# Patient Record
Sex: Male | Born: 2000 | Hispanic: No | Marital: Single | Smoking: Current some day smoker
Health system: Southern US, Community
[De-identification: ages and names within clinical notes are randomized; demographics above are authoritative.]

## PROBLEM LIST (undated history)

## (undated) DIAGNOSIS — S3991XA Unspecified injury of abdomen, initial encounter: Secondary | ICD-10-CM

## (undated) DIAGNOSIS — S298XXA Other specified injuries of thorax, initial encounter: Secondary | ICD-10-CM

## (undated) DIAGNOSIS — S42201B Unspecified fracture of upper end of right humerus, initial encounter for open fracture: Secondary | ICD-10-CM

## (undated) DIAGNOSIS — S060XAA Concussion with loss of consciousness status unknown, initial encounter: Secondary | ICD-10-CM

## (undated) HISTORY — DX: Concussion with loss of consciousness status unknown, initial encounter: S06.0XAA

## (undated) HISTORY — DX: Other specified injuries of thorax, initial encounter: S29.8XXA

## (undated) HISTORY — DX: Unspecified injury of abdomen, initial encounter: S39.91XA

---
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
## 2018-09-14 ENCOUNTER — Emergency Department (HOSPITAL_COMMUNITY): Payer: Medicaid Other

## 2018-09-14 ENCOUNTER — Other Ambulatory Visit: Payer: Self-pay

## 2018-09-14 ENCOUNTER — Inpatient Hospital Stay (HOSPITAL_COMMUNITY)
Admission: EM | Admit: 2018-09-14 | Discharge: 2018-09-17 | DRG: 958 | Disposition: A | Discharge status: Home / Self Care | Payer: Medicaid Other | Attending: General Surgery | Admitting: General Surgery

## 2018-09-14 ENCOUNTER — Inpatient Hospital Stay (HOSPITAL_COMMUNITY): Payer: Medicaid Other

## 2018-09-14 ENCOUNTER — Encounter (HOSPITAL_COMMUNITY): Payer: Self-pay | Admitting: *Deleted

## 2018-09-14 DIAGNOSIS — W3400XA Accidental discharge from unspecified firearms or gun, initial encounter: Secondary | ICD-10-CM

## 2018-09-14 DIAGNOSIS — D62 Acute posthemorrhagic anemia: Secondary | ICD-10-CM | POA: Diagnosis present

## 2018-09-14 DIAGNOSIS — S143XXA Injury of brachial plexus, initial encounter: Secondary | ICD-10-CM | POA: Diagnosis present

## 2018-09-14 DIAGNOSIS — S2241XA Multiple fractures of ribs, right side, initial encounter for closed fracture: Secondary | ICD-10-CM | POA: Diagnosis present

## 2018-09-14 DIAGNOSIS — S36112A Contusion of liver, initial encounter: Secondary | ICD-10-CM | POA: Diagnosis present

## 2018-09-14 DIAGNOSIS — R402363 Coma scale, best motor response, obeys commands, at hospital admission: Secondary | ICD-10-CM | POA: Diagnosis present

## 2018-09-14 DIAGNOSIS — T148XXA Other injury of unspecified body region, initial encounter: Secondary | ICD-10-CM

## 2018-09-14 DIAGNOSIS — Y92410 Unspecified street and highway as the place of occurrence of the external cause: Secondary | ICD-10-CM | POA: Diagnosis not present

## 2018-09-14 DIAGNOSIS — T1490XA Injury, unspecified, initial encounter: Secondary | ICD-10-CM

## 2018-09-14 DIAGNOSIS — S42201B Unspecified fracture of upper end of right humerus, initial encounter for open fracture: Principal | ICD-10-CM | POA: Diagnosis present

## 2018-09-14 DIAGNOSIS — Y9301 Activity, walking, marching and hiking: Secondary | ICD-10-CM | POA: Diagnosis present

## 2018-09-14 DIAGNOSIS — F17290 Nicotine dependence, other tobacco product, uncomplicated: Secondary | ICD-10-CM | POA: Diagnosis present

## 2018-09-14 DIAGNOSIS — R402253 Coma scale, best verbal response, oriented, at hospital admission: Secondary | ICD-10-CM | POA: Diagnosis present

## 2018-09-14 DIAGNOSIS — M25511 Pain in right shoulder: Secondary | ICD-10-CM | POA: Diagnosis present

## 2018-09-14 DIAGNOSIS — S2239XA Fracture of one rib, unspecified side, initial encounter for closed fracture: Secondary | ICD-10-CM

## 2018-09-14 DIAGNOSIS — R402143 Coma scale, eyes open, spontaneous, at hospital admission: Secondary | ICD-10-CM | POA: Diagnosis present

## 2018-09-14 HISTORY — DX: Accidental discharge from unspecified firearms or gun, initial encounter: W34.00XA

## 2018-09-14 HISTORY — DX: Unspecified fracture of upper end of right humerus, initial encounter for open fracture: S42.201B

## 2018-09-14 LAB — BPAM RBC
Blood Product Expiration Date: 202004092359
Blood Product Expiration Date: 202004122359
ISSUE DATE / TIME: 202003230403
ISSUE DATE / TIME: 202003230403
Unit Type and Rh: 5100
Unit Type and Rh: 5100

## 2018-09-14 LAB — URINALYSIS, ROUTINE W REFLEX MICROSCOPIC
Bilirubin Urine: NEGATIVE
Glucose, UA: NEGATIVE mg/dL
Hgb urine dipstick: NEGATIVE
Ketones, ur: NEGATIVE mg/dL
Leukocytes,Ua: NEGATIVE
NITRITE: NEGATIVE
Protein, ur: NEGATIVE mg/dL
Specific Gravity, Urine: >1.046 — ABNORMAL HIGH (ref 1.005–1.030)
pH: 6 (ref 5.0–8.0)

## 2018-09-14 LAB — COMPREHENSIVE METABOLIC PANEL
ALT: 124 U/L — ABNORMAL HIGH (ref 0–44)
ANION GAP: 14 (ref 5–15)
AST: 129 U/L — ABNORMAL HIGH (ref 15–41)
Albumin: 4.4 g/dL (ref 3.5–5.0)
Alkaline Phosphatase: 45 U/L (ref 38–126)
BUN: 12 mg/dL (ref 6–20)
CO2: 19 mmol/L — ABNORMAL LOW (ref 22–32)
Calcium: 9.3 mg/dL (ref 8.9–10.3)
Chloride: 104 mmol/L (ref 98–111)
Creatinine, Ser: 1.19 mg/dL (ref 0.61–1.24)
GFR calc Af Amer: >60 mL/min (ref >60)
GFR calc non Af Amer: >60 mL/min (ref >60)
Glucose, Bld: 138 mg/dL — ABNORMAL HIGH (ref 70–99)
Potassium: 4.1 mmol/L (ref 3.5–5.1)
Sodium: 137 mmol/L (ref 135–145)
Total Bilirubin: 1 mg/dL (ref 0.3–1.2)
Total Protein: 7.1 g/dL (ref 6.5–8.1)

## 2018-09-14 LAB — ABO/RH: ABO/RH(D): O POS

## 2018-09-14 LAB — BPAM FFP
Blood Product Expiration Date: 202004052359
Blood Product Expiration Date: 202004062359
ISSUE DATE / TIME: 202003230403
ISSUE DATE / TIME: 202003230403
Unit Type and Rh: 6200
Unit Type and Rh: 6200

## 2018-09-14 LAB — CBC
HCT: 35.9 % — ABNORMAL LOW (ref 39.0–52.0)
HCT: 37 % — ABNORMAL LOW (ref 39.0–52.0)
HEMATOCRIT: 39.7 % (ref 39.0–52.0)
Hemoglobin: 11.6 g/dL — ABNORMAL LOW (ref 13.0–17.0)
Hemoglobin: 12.7 g/dL — ABNORMAL LOW (ref 13.0–17.0)
Hemoglobin: 11.9 g/dL — ABNORMAL LOW (ref 13.0–17.0)
MCH: 23.5 pg — ABNORMAL LOW (ref 26.0–34.0)
MCH: 23.3 pg — ABNORMAL LOW (ref 26.0–34.0)
MCH: 23.3 pg — AB (ref 26.0–34.0)
MCHC: 32.3 g/dL (ref 30.0–36.0)
MCHC: 32 g/dL (ref 30.0–36.0)
MCHC: 32.2 g/dL (ref 30.0–36.0)
MCV: 72.7 fL — ABNORMAL LOW (ref 80.0–100.0)
MCV: 72.8 fL — ABNORMAL LOW (ref 80.0–100.0)
MCV: 72.4 fL — ABNORMAL LOW (ref 80.0–100.0)
Platelets: 186 10*3/uL (ref 150–400)
Platelets: 224 10*3/uL (ref 150–400)
Platelets: 218 10*3/uL (ref 150–400)
RBC: 4.94 MIL/uL (ref 4.22–5.81)
RBC: 5.45 MIL/uL (ref 4.22–5.81)
RBC: 5.11 MIL/uL (ref 4.22–5.81)
RDW: 14.2 % (ref 11.5–15.5)
RDW: 14.3 % (ref 11.5–15.5)
RDW: 14.2 % (ref 11.5–15.5)
WBC: 18 10*3/uL — ABNORMAL HIGH (ref 4.0–10.5)
WBC: 16.7 10*3/uL — ABNORMAL HIGH (ref 4.0–10.5)
WBC: 9.9 10*3/uL (ref 4.0–10.5)
nRBC: 0 % (ref 0.0–0.2)
nRBC: 0 % (ref 0.0–0.2)
nRBC: 0 % (ref 0.0–0.2)

## 2018-09-14 LAB — BASIC METABOLIC PANEL
Anion gap: 12 (ref 5–15)
BUN: 11 mg/dL (ref 6–20)
CO2: 20 mmol/L — ABNORMAL LOW (ref 22–32)
CREATININE: 1.04 mg/dL (ref 0.61–1.24)
Calcium: 8.9 mg/dL (ref 8.9–10.3)
Chloride: 104 mmol/L (ref 98–111)
GFR calc Af Amer: >60 mL/min (ref >60)
GFR calc non Af Amer: >60 mL/min (ref >60)
Glucose, Bld: 114 mg/dL — ABNORMAL HIGH (ref 70–99)
Potassium: 3.7 mmol/L (ref 3.5–5.1)
Sodium: 136 mmol/L (ref 135–145)

## 2018-09-14 LAB — TYPE AND SCREEN
ABO/RH(D): O POS
Antibody Screen: NEGATIVE
Unit division: 0
Unit division: 0

## 2018-09-14 LAB — PREPARE FRESH FROZEN PLASMA
Unit division: 0
Unit division: 0

## 2018-09-14 LAB — RAPID URINE DRUG SCREEN, HOSP PERFORMED
Amphetamines: NOT DETECTED
Barbiturates: NOT DETECTED
Benzodiazepines: NOT DETECTED
COCAINE: NOT DETECTED
OPIATES: POSITIVE — AB
Tetrahydrocannabinol: NOT DETECTED

## 2018-09-14 LAB — BLOOD PRODUCT ORDER (VERBAL) VERIFICATION

## 2018-09-14 LAB — ETHANOL

## 2018-09-14 LAB — LACTIC ACID, PLASMA: LACTIC ACID, VENOUS: 2.7 mmol/L — AB (ref 0.5–1.9)

## 2018-09-14 LAB — PROTIME-INR
INR: 1.1 (ref 0.8–1.2)
PROTHROMBIN TIME: 14.3 s (ref 11.4–15.2)

## 2018-09-14 LAB — HIV ANTIBODY (ROUTINE TESTING W REFLEX): HIV Screen 4th Generation wRfx: NONREACTIVE

## 2018-09-14 LAB — CDS SEROLOGY

## 2018-09-14 MED ORDER — MORPHINE SULFATE (PF) 4 MG/ML IV SOLN
4.0000 mg | Freq: Once | INTRAVENOUS | Status: AC
  Administered 2018-09-14: 4 mg via INTRAVENOUS

## 2018-09-14 MED ORDER — ONDANSETRON 4 MG PO TBDP: 4.0000 mg | ORAL_TABLET | Freq: Four times a day (QID) | ORAL | Status: DC | PRN

## 2018-09-14 MED ORDER — IOHEXOL 300 MG/ML  SOLN
100.0000 mL | Freq: Once | INTRAMUSCULAR | Status: AC | PRN
  Administered 2018-09-14: 100 mL via INTRAVENOUS

## 2018-09-14 MED ORDER — CEFAZOLIN SODIUM-DEXTROSE 2-4 GM/100ML-% IV SOLN
2.0000 g | Freq: Three times a day (TID) | INTRAVENOUS | Status: AC
  Administered 2018-09-14 – 2018-09-15 (×3): 2 g via INTRAVENOUS
  Filled 2018-09-14 (×3): qty 100

## 2018-09-14 MED ORDER — SODIUM CHLORIDE 0.9 % IV BOLUS
1000.0000 mL | Freq: Once | INTRAVENOUS | Status: AC
  Administered 2018-09-14: 1000 mL via INTRAVENOUS

## 2018-09-14 MED ORDER — PANTOPRAZOLE SODIUM 40 MG IV SOLR: 40.0000 mg | Freq: Every day | INTRAVENOUS | Status: DC

## 2018-09-14 MED ORDER — ONDANSETRON HCL 4 MG/2ML IJ SOLN
4.0000 mg | Freq: Four times a day (QID) | INTRAMUSCULAR | Status: DC | PRN
  Administered 2018-09-15: 4 mg via INTRAVENOUS
  Filled 2018-09-14: qty 2

## 2018-09-14 MED ORDER — KCL IN DEXTROSE-NACL 20-5-0.45 MEQ/L-%-% IV SOLN
INTRAVENOUS | Status: DC
  Administered 2018-09-14 – 2018-09-15 (×2): via INTRAVENOUS
  Filled 2018-09-14 (×4): qty 1000

## 2018-09-14 MED ORDER — METHOCARBAMOL 750 MG PO TABS
750.0000 mg | ORAL_TABLET | Freq: Three times a day (TID) | ORAL | Status: DC
  Administered 2018-09-14 – 2018-09-17 (×9): 750 mg via ORAL
  Filled 2018-09-14 (×8): qty 1

## 2018-09-14 MED ORDER — PANTOPRAZOLE SODIUM 40 MG PO TBEC
40.0000 mg | DELAYED_RELEASE_TABLET | Freq: Every day | ORAL | Status: DC
  Administered 2018-09-14 – 2018-09-17 (×3): 40 mg via ORAL
  Filled 2018-09-14 (×4): qty 1

## 2018-09-14 MED ORDER — ACETAMINOPHEN 325 MG PO TABS: 650.0000 mg | ORAL_TABLET | ORAL | Status: DC | PRN

## 2018-09-14 MED ORDER — FENTANYL CITRATE (PF) 100 MCG/2ML IJ SOLN
INTRAMUSCULAR | Status: AC
  Filled 2018-09-14: qty 2

## 2018-09-14 MED ORDER — OXYCODONE HCL 5 MG PO TABS
10.0000 mg | ORAL_TABLET | ORAL | Status: DC | PRN
  Administered 2018-09-14 – 2018-09-17 (×9): 10 mg via ORAL
  Filled 2018-09-14 (×9): qty 2

## 2018-09-14 MED ORDER — OXYCODONE HCL 5 MG PO TABS
5.0000 mg | ORAL_TABLET | ORAL | Status: DC | PRN
  Administered 2018-09-15: 5 mg via ORAL
  Filled 2018-09-14: qty 1

## 2018-09-14 MED ORDER — ACETAMINOPHEN 500 MG PO TABS
1000.0000 mg | ORAL_TABLET | Freq: Four times a day (QID) | ORAL | Status: AC
  Administered 2018-09-14 – 2018-09-15 (×5): 1000 mg via ORAL
  Filled 2018-09-14 (×5): qty 2

## 2018-09-14 MED ORDER — HYDROMORPHONE HCL 1 MG/ML IJ SOLN
1.0000 mg | INTRAMUSCULAR | Status: DC | PRN
  Administered 2018-09-14 (×3): 1 mg via INTRAVENOUS
  Filled 2018-09-14 (×3): qty 1

## 2018-09-14 MED ORDER — HYDRALAZINE HCL 20 MG/ML IJ SOLN: 10.0000 mg | INTRAMUSCULAR | Status: DC | PRN

## 2018-09-14 MED ORDER — METHOCARBAMOL 500 MG PO TABS
500.0000 mg | ORAL_TABLET | Freq: Three times a day (TID) | ORAL | Status: DC
  Filled 2018-09-14: qty 1
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
## 2018-09-14 NOTE — ED Notes (Signed)
Fentanyl 100 mg iv for DODQ\[id number]\

## 2018-09-14 NOTE — Plan of Care (Signed)

## 2018-09-14 NOTE — Progress Notes (Signed)
Orthopedic Tech Progress Note Patient Details:  Thomas Anthony 11-07-2000 [id number]  Ortho Devices Type of Ortho Device: Sling immobilizer Ortho Device/Splint Location: URE Ortho Device/Splint Interventions: Adjustment, Application, Ordered   Post Interventions Patient Tolerated: Fair Instructions Provided: Care of device, Adjustment of device   Janit Pagan 09/14/2018, 8:03 AM
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
## 2018-09-14 NOTE — ED Notes (Signed)
Pt talking on the phone to his brother
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
## 2018-09-14 NOTE — ED Notes (Signed)
pts mother is here

## 2018-09-14 NOTE — ED Notes (Signed)
[facility] at  The bedside
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
## 2018-09-14 NOTE — ED Notes (Signed)
bp 122/80 p92

## 2018-09-14 NOTE — ED Notes (Signed)
Morphine 4mg   Claiborne Billings m rn iv

## 2018-09-14 NOTE — ED Notes (Signed)
I heard dr Grandville Silos tell the  edp that he was going to call a ortho doctor but he did not call in my presence and I have no ideal if and who and what time he called if he did
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
## 2018-09-14 NOTE — ED Notes (Signed)
Dr Stark Jock talking to the mother at bedside
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
## 2018-09-15 ENCOUNTER — Inpatient Hospital Stay (HOSPITAL_COMMUNITY): Payer: Medicaid Other

## 2018-09-15 ENCOUNTER — Encounter (HOSPITAL_COMMUNITY): Admission: EM | Disposition: A | Discharge status: Home / Self Care | Payer: Self-pay | Source: Home / Self Care

## 2018-09-15 ENCOUNTER — Inpatient Hospital Stay (HOSPITAL_COMMUNITY): Payer: Medicaid Other | Admitting: Anesthesiology

## 2018-09-15 HISTORY — PX: ORIF HUMERUS FRACTURE: SHX2126

## 2018-09-15 LAB — CBC
HCT: 34.5 % — ABNORMAL LOW (ref 39.0–52.0)
Hemoglobin: 10.9 g/dL — ABNORMAL LOW (ref 13.0–17.0)
MCH: 22.8 pg — ABNORMAL LOW (ref 26.0–34.0)
MCHC: 31.6 g/dL (ref 30.0–36.0)
MCV: 72 fL — AB (ref 80.0–100.0)
NRBC: 0 % (ref 0.0–0.2)
Platelets: 214 10*3/uL (ref 150–400)
RBC: 4.79 MIL/uL (ref 4.22–5.81)
RDW: 13.9 % (ref 11.5–15.5)
WBC: 7 10*3/uL (ref 4.0–10.5)

## 2018-09-15 LAB — MRSA PCR SCREENING: MRSA by PCR: NEGATIVE

## 2018-09-15 SURGERY — OPEN REDUCTION INTERNAL FIXATION (ORIF) PROXIMAL HUMERUS FRACTURE
Anesthesia: Regional | Site: Shoulder | Laterality: Right

## 2018-09-15 MED ORDER — LACTATED RINGERS IV SOLN
INTRAVENOUS | Status: DC | PRN
  Administered 2018-09-15 (×2): via INTRAVENOUS

## 2018-09-15 MED ORDER — CEFAZOLIN SODIUM-DEXTROSE 2-4 GM/100ML-% IV SOLN
2.0000 g | Freq: Four times a day (QID) | INTRAVENOUS | Status: AC
  Administered 2018-09-15 – 2018-09-16 (×3): 2 g via INTRAVENOUS
  Filled 2018-09-15 (×3): qty 100

## 2018-09-15 MED ORDER — MENTHOL 3 MG MT LOZG: 1.0000 | LOZENGE | OROMUCOSAL | Status: DC | PRN

## 2018-09-15 MED ORDER — HYDROMORPHONE HCL 1 MG/ML IJ SOLN
INTRAMUSCULAR | Status: AC
  Filled 2018-09-15: qty 1

## 2018-09-15 MED ORDER — PROPOFOL 10 MG/ML IV BOLUS
INTRAVENOUS | Status: DC | PRN
  Administered 2018-09-15: 200 mg via INTRAVENOUS

## 2018-09-15 MED ORDER — METOCLOPRAMIDE HCL 10 MG PO TABS: 5.0000 mg | ORAL_TABLET | Freq: Three times a day (TID) | ORAL | Status: DC | PRN

## 2018-09-15 MED ORDER — PHENYLEPHRINE 40 MCG/ML (10ML) SYRINGE FOR IV PUSH (FOR BLOOD PRESSURE SUPPORT)
PREFILLED_SYRINGE | INTRAVENOUS | Status: AC
  Filled 2018-09-15: qty 30

## 2018-09-15 MED ORDER — ONDANSETRON HCL 4 MG PO TABS: 4.0000 mg | ORAL_TABLET | Freq: Four times a day (QID) | ORAL | Status: DC | PRN

## 2018-09-15 MED ORDER — BUPIVACAINE LIPOSOME 1.3 % IJ SUSP
INTRAMUSCULAR | Status: DC | PRN
  Administered 2018-09-15: 133 mg via PERINEURAL

## 2018-09-15 MED ORDER — PHENOL 1.4 % MT LIQD: 1.0000 | OROMUCOSAL | Status: DC | PRN

## 2018-09-15 MED ORDER — GABAPENTIN 600 MG PO TABS
300.0000 mg | ORAL_TABLET | Freq: Two times a day (BID) | ORAL | Status: DC
  Administered 2018-09-15 – 2018-09-17 (×5): 300 mg via ORAL
  Filled 2018-09-15 (×5): qty 1

## 2018-09-15 MED ORDER — KETOROLAC TROMETHAMINE 15 MG/ML IJ SOLN
15.0000 mg | Freq: Three times a day (TID) | INTRAMUSCULAR | Status: AC
  Administered 2018-09-15 – 2018-09-17 (×6): 15 mg via INTRAVENOUS
  Filled 2018-09-15 (×6): qty 1

## 2018-09-15 MED ORDER — ONDANSETRON HCL 4 MG/2ML IJ SOLN
INTRAMUSCULAR | Status: AC
  Filled 2018-09-15: qty 2

## 2018-09-15 MED ORDER — FENTANYL CITRATE (PF) 100 MCG/2ML IJ SOLN
INTRAMUSCULAR | Status: DC | PRN
  Administered 2018-09-15 (×2): 50 ug via INTRAVENOUS
  Administered 2018-09-15: 100 ug via INTRAVENOUS

## 2018-09-15 MED ORDER — PROPOFOL 10 MG/ML IV BOLUS
INTRAVENOUS | Status: AC
  Filled 2018-09-15: qty 20

## 2018-09-15 MED ORDER — FENTANYL CITRATE (PF) 250 MCG/5ML IJ SOLN
INTRAMUSCULAR | Status: AC
  Filled 2018-09-15: qty 5

## 2018-09-15 MED ORDER — ROCURONIUM BROMIDE 50 MG/5ML IV SOSY
PREFILLED_SYRINGE | INTRAVENOUS | Status: AC
  Filled 2018-09-15: qty 10

## 2018-09-15 MED ORDER — DEXAMETHASONE SODIUM PHOSPHATE 10 MG/ML IJ SOLN
INTRAMUSCULAR | Status: DC | PRN
  Administered 2018-09-15: 10 mg via INTRAVENOUS

## 2018-09-15 MED ORDER — DEXAMETHASONE SODIUM PHOSPHATE 10 MG/ML IJ SOLN
INTRAMUSCULAR | Status: AC
  Filled 2018-09-15: qty 2

## 2018-09-15 MED ORDER — DOCUSATE SODIUM 100 MG PO CAPS
100.0000 mg | ORAL_CAPSULE | Freq: Two times a day (BID) | ORAL | Status: DC
  Administered 2018-09-15 – 2018-09-17 (×5): 100 mg via ORAL
  Filled 2018-09-15 (×5): qty 1

## 2018-09-15 MED ORDER — SUGAMMADEX SODIUM 200 MG/2ML IV SOLN
INTRAVENOUS | Status: DC | PRN
  Administered 2018-09-15: 200 mg via INTRAVENOUS

## 2018-09-15 MED ORDER — PROMETHAZINE HCL 25 MG/ML IJ SOLN: 6.2500 mg | INTRAMUSCULAR | Status: DC | PRN

## 2018-09-15 MED ORDER — LIDOCAINE 2% (20 MG/ML) 5 ML SYRINGE
INTRAMUSCULAR | Status: AC
  Filled 2018-09-15: qty 5

## 2018-09-15 MED ORDER — ROCURONIUM BROMIDE 50 MG/5ML IV SOSY
PREFILLED_SYRINGE | INTRAVENOUS | Status: DC | PRN
  Administered 2018-09-15: 50 mg via INTRAVENOUS
  Administered 2018-09-15 (×3): 10 mg via INTRAVENOUS

## 2018-09-15 MED ORDER — BUPIVACAINE HCL (PF) 0.5 % IJ SOLN
INTRAMUSCULAR | Status: DC | PRN
  Administered 2018-09-15: 15 mL via PERINEURAL

## 2018-09-15 MED ORDER — ONDANSETRON HCL 4 MG/2ML IJ SOLN
INTRAMUSCULAR | Status: DC | PRN
  Administered 2018-09-15: 4 mg via INTRAVENOUS

## 2018-09-15 MED ORDER — PHENYLEPHRINE HCL 10 MG/ML IJ SOLN
INTRAMUSCULAR | Status: DC | PRN
  Administered 2018-09-15 (×2): 80 ug via INTRAVENOUS

## 2018-09-15 MED ORDER — METOCLOPRAMIDE HCL 5 MG/ML IJ SOLN: 5.0000 mg | Freq: Three times a day (TID) | INTRAMUSCULAR | Status: DC | PRN

## 2018-09-15 MED ORDER — MEPERIDINE HCL 50 MG/ML IJ SOLN: 6.2500 mg | INTRAMUSCULAR | Status: DC | PRN

## 2018-09-15 MED ORDER — ONDANSETRON HCL 4 MG/2ML IJ SOLN: 4.0000 mg | Freq: Four times a day (QID) | INTRAMUSCULAR | Status: DC | PRN

## 2018-09-15 MED ORDER — OXYCODONE HCL 5 MG PO TABS: 5.0000 mg | ORAL_TABLET | Freq: Once | ORAL | Status: DC | PRN

## 2018-09-15 MED ORDER — CEFAZOLIN SODIUM-DEXTROSE 1-4 GM/50ML-% IV SOLN
INTRAVENOUS | Status: DC | PRN
  Administered 2018-09-15: 2 g via INTRAVENOUS

## 2018-09-15 MED ORDER — LIDOCAINE 2% (20 MG/ML) 5 ML SYRINGE
INTRAMUSCULAR | Status: DC | PRN
  Administered 2018-09-15: 100 mg via INTRAVENOUS

## 2018-09-15 MED ORDER — 0.9 % SODIUM CHLORIDE (POUR BTL) OPTIME
TOPICAL | Status: DC | PRN
  Administered 2018-09-15: 1000 mL

## 2018-09-15 MED ORDER — MIDAZOLAM HCL 5 MG/5ML IJ SOLN
INTRAMUSCULAR | Status: DC | PRN
  Administered 2018-09-15: 2 mg via INTRAVENOUS

## 2018-09-15 MED ORDER — MIDAZOLAM HCL 2 MG/2ML IJ SOLN
INTRAMUSCULAR | Status: AC
  Filled 2018-09-15: qty 2

## 2018-09-15 MED ORDER — SODIUM CHLORIDE 0.9 % IV SOLN
INTRAVENOUS | Status: DC | PRN
  Administered 2018-09-15: 50 ug/min via INTRAVENOUS

## 2018-09-15 MED ORDER — OXYCODONE HCL 5 MG/5ML PO SOLN: 5.0000 mg | Freq: Once | ORAL | Status: DC | PRN

## 2018-09-15 MED ORDER — HYDROMORPHONE HCL 1 MG/ML IJ SOLN
0.2500 mg | INTRAMUSCULAR | Status: DC | PRN
  Administered 2018-09-15: 0.5 mg via INTRAVENOUS
  Administered 2018-09-15: 0.25 mg via INTRAVENOUS

## 2018-09-15 SURGICAL SUPPLY — 77 items
BENZOIN TINCTURE PRP APPL 2/3 (GAUZE/BANDAGES/DRESSINGS) ×6 IMPLANT
BIT DRILL 3.2 (BIT) ×2
BIT DRILL 3.2XCALB NS DISP (BIT) ×1 IMPLANT
BIT DRILL CALIBRATED 2.7 (BIT) ×2 IMPLANT
BIT DRILL CALIBRATED 2.7MM (BIT) ×1
BIT DRL 3.2XCALB NS DISP (BIT) ×1
BNDG GAUZE ELAST 4 BULKY (GAUZE/BANDAGES/DRESSINGS) ×6 IMPLANT
BRUSH SCRUB SURG 4.25 DISP (MISCELLANEOUS) ×6 IMPLANT
COVER SURGICAL LIGHT HANDLE (MISCELLANEOUS) ×3 IMPLANT
COVER WAND RF STERILE (DRAPES) ×3 IMPLANT
DRAPE C-ARM 42X72 X-RAY (DRAPES) ×3 IMPLANT
DRAPE C-ARMOR (DRAPES) ×3 IMPLANT
DRAPE IMP U-DRAPE 54X76 (DRAPES) ×3 IMPLANT
DRAPE INCISE IOBAN 66X45 STRL (DRAPES) IMPLANT
DRAPE ORTHO SPLIT 77X108 STRL (DRAPES) ×4
DRAPE SURG ORHT 6 SPLT 77X108 (DRAPES) ×2 IMPLANT
DRAPE U-SHAPE 47X51 STRL (DRAPES) ×3 IMPLANT
DRSG ADAPTIC 3X8 NADH LF (GAUZE/BANDAGES/DRESSINGS) ×3 IMPLANT
DRSG MEPILEX BORDER 4X12 (GAUZE/BANDAGES/DRESSINGS) ×3 IMPLANT
DRSG PAD ABDOMINAL 8X10 ST (GAUZE/BANDAGES/DRESSINGS) ×6 IMPLANT
ELECT REM PT RETURN 9FT ADLT (ELECTROSURGICAL) ×3
ELECTRODE REM PT RTRN 9FT ADLT (ELECTROSURGICAL) ×1 IMPLANT
EVACUATOR 1/8 PVC DRAIN (DRAIN) IMPLANT
GAUZE SPONGE 4X4 12PLY STRL (GAUZE/BANDAGES/DRESSINGS) ×6 IMPLANT
GLOVE BIO SURGEON STRL SZ7.5 (GLOVE) ×3 IMPLANT
GLOVE BIO SURGEON STRL SZ8 (GLOVE) ×3 IMPLANT
GLOVE BIOGEL PI IND STRL 7.5 (GLOVE) ×1 IMPLANT
GLOVE BIOGEL PI IND STRL 8 (GLOVE) ×1 IMPLANT
GLOVE BIOGEL PI INDICATOR 7.5 (GLOVE) ×2
GLOVE BIOGEL PI INDICATOR 8 (GLOVE) ×2
GOWN STRL REUS W/ TWL LRG LVL3 (GOWN DISPOSABLE) ×2 IMPLANT
GOWN STRL REUS W/ TWL XL LVL3 (GOWN DISPOSABLE) ×1 IMPLANT
GOWN STRL REUS W/TWL LRG LVL3 (GOWN DISPOSABLE) ×4
GOWN STRL REUS W/TWL XL LVL3 (GOWN DISPOSABLE) ×2
K-WIRE 2X5 SS THRDED S3 (WIRE) ×15
KIT BASIN OR (CUSTOM PROCEDURE TRAY) ×3 IMPLANT
KIT TURNOVER KIT B (KITS) ×3 IMPLANT
KWIRE 2X5 SS THRDED S3 (WIRE) ×5 IMPLANT
LOOP VESSEL MAXI BLUE (MISCELLANEOUS) IMPLANT
MANIFOLD NEPTUNE II (INSTRUMENTS) ×3 IMPLANT
NS IRRIG 1000ML POUR BTL (IV SOLUTION) ×3 IMPLANT
PACK TOTAL JOINT (CUSTOM PROCEDURE TRAY) ×3 IMPLANT
PACK UNIVERSAL I (CUSTOM PROCEDURE TRAY) ×3 IMPLANT
PAD ARMBOARD 7.5X6 YLW CONV (MISCELLANEOUS) ×3 IMPLANT
PEG LOCKING 3.2MMX44 (Peg) ×6 IMPLANT
PEG LOCKING 3.2MMX46 (Peg) ×3 IMPLANT
PEG LOCKING 3.2X36 (Screw) ×3 IMPLANT
PLATE PHP 11 H LOW RT (Plate) ×3 IMPLANT
SCREW LOW PROF TIS 3.5X28MM (Screw) ×3 IMPLANT
SCREW LOW PROFILE 3.5X30MM TIS (Screw) ×6 IMPLANT
SCREW LP NL T15 3.5X24 (Screw) ×3 IMPLANT
SCREW LP NL T15 3.5X26 (Screw) ×6 IMPLANT
SCREW T15 LP CORT 3.5X50MM NS (Screw) ×3 IMPLANT
SCREW T15 LP CORT 3.5X60MM NS (Screw) ×3 IMPLANT
SCREW T15 MD 3.5X48MM NS (Screw) ×3 IMPLANT
SPONGE LAP 18X18 RF (DISPOSABLE) ×3 IMPLANT
STAPLER VISISTAT 35W (STAPLE) ×3 IMPLANT
STOCKINETTE IMPERVIOUS LG (DRAPES) ×3 IMPLANT
SUCTION FRAZIER HANDLE 10FR (MISCELLANEOUS) ×2
SUCTION TUBE FRAZIER 10FR DISP (MISCELLANEOUS) ×1 IMPLANT
SUT ETHIBOND 5 LR DA (SUTURE) ×3 IMPLANT
SUT ETHILON 2 0 FS 18 (SUTURE) ×12 IMPLANT
SUT FIBERWIRE #2 38 T-5 BLUE (SUTURE) ×3
SUT VIC AB 0 CT1 27 (SUTURE) ×2
SUT VIC AB 0 CT1 27XBRD ANBCTR (SUTURE) ×1 IMPLANT
SUT VIC AB 1 CT1 27 (SUTURE) ×4
SUT VIC AB 1 CT1 27XBRD ANBCTR (SUTURE) ×2 IMPLANT
SUT VIC AB 2-0 CT1 27 (SUTURE) ×4
SUT VIC AB 2-0 CT1 TAPERPNT 27 (SUTURE) ×2 IMPLANT
SUTURE FIBERWR #2 38 T-5 BLUE (SUTURE) ×1 IMPLANT
SYR 5ML LL (SYRINGE) IMPLANT
TAPE CLOTH SURG 4X10 WHT LF (GAUZE/BANDAGES/DRESSINGS) ×3 IMPLANT
TAPE CLOTH SURG 6X10 WHT LF (GAUZE/BANDAGES/DRESSINGS) ×3 IMPLANT
TOWEL OR 17X24 6PK STRL BLUE (TOWEL DISPOSABLE) ×3 IMPLANT
TOWEL OR 17X26 10 PK STRL BLUE (TOWEL DISPOSABLE) ×6 IMPLANT
WATER STERILE IRR 1000ML POUR (IV SOLUTION) ×3 IMPLANT
YANKAUER SUCT BULB TIP NO VENT (SUCTIONS) IMPLANT

## 2018-09-15 NOTE — Transfer of Care (Signed)
Immediate Anesthesia Transfer of Care Note  Patient: Thomas Anthony  Procedure(s) Performed: OPEN REDUCTION INTERNAL FIXATION (ORIF) PROXIMAL HUMERUS FRACTURE (Right Shoulder)  Patient Location: PACU  Anesthesia Type:General  Level of Consciousness: awake, alert  and oriented  Airway & Oxygen Therapy: Patient Spontanous Breathing and Patient connected to face mask oxygen  Post-op Assessment: Report given to RN, Post -op Vital signs reviewed and stable and Patient moving all extremities X 4  Post vital signs: Reviewed and stable  Last Vitals:  Vitals Value Taken Time  BP 143/87 09/15/2018 12:04 PM  Temp    Pulse 87 09/15/2018 12:04 PM  Resp 20 09/15/2018 12:04 PM  SpO2 100 % 09/15/2018 12:04 PM  Vitals shown include unvalidated device data.  Last Pain:  Vitals:   09/15/18 0400  TempSrc: Oral  PainSc:          Complications: No apparent anesthesia complications

## 2018-09-15 NOTE — Plan of Care (Signed)

## 2018-09-15 NOTE — Plan of Care (Signed)
  Problem: Education: Goal: Knowledge of General Education information will improve Description Including pain rating scale, medication(s)/side effects and non-pharmacologic comfort measures 09/15/2018 1537 by Lawanda Cousins, RN Outcome: Progressing 09/15/2018 1527 by Lawanda Cousins, RN Outcome: Progressing   Problem: Health Behavior/Discharge Planning: Goal: Ability to manage health-related needs will improve 09/15/2018 1537 by Lawanda Cousins, RN Outcome: Progressing 09/15/2018 1527 by Lawanda Cousins, RN Outcome: Progressing   Problem: Clinical Measurements: Goal: Ability to maintain clinical measurements within normal limits will improve 09/15/2018 1537 by Lawanda Cousins, RN Outcome: Progressing 09/15/2018 1527 by Lawanda Cousins, RN Outcome: Progressing Goal: Will remain free from infection 09/15/2018 1537 by Lawanda Cousins, RN Outcome: Progressing 09/15/2018 1527 by Lawanda Cousins, RN Outcome: Progressing Goal: Diagnostic test results will improve 09/15/2018 1537 by Lawanda Cousins, RN Outcome: Progressing 09/15/2018 1527 by Lawanda Cousins, RN Outcome: Progressing Goal: Respiratory complications will improve 09/15/2018 1537 by Lawanda Cousins, RN Outcome: Progressing 09/15/2018 1527 by Lawanda Cousins, RN Outcome: Progressing Goal: Cardiovascular complication will be avoided 09/15/2018 1537 by Lawanda Cousins, RN Outcome: Progressing 09/15/2018 1527 by Lawanda Cousins, RN Outcome: Progressing   Problem: Activity: Goal: Risk for activity intolerance will decrease 09/15/2018 1537 by Lawanda Cousins, RN Outcome: Progressing 09/15/2018 1527 by Lawanda Cousins, RN Outcome: Progressing   Problem: Nutrition: Goal: Adequate nutrition will be maintained 09/15/2018 1537 by Lawanda Cousins, RN Outcome: Progressing 09/15/2018 1527 by Lawanda Cousins, RN Outcome: Progressing   Problem: Coping: Goal: Level of anxiety will decrease 09/15/2018 1537 by Lawanda Cousins, RN Outcome:  Progressing 09/15/2018 1527 by Lawanda Cousins, RN Outcome: Progressing   Problem: Elimination: Goal: Will not experience complications related to bowel motility 09/15/2018 1537 by Lawanda Cousins, RN Outcome: Progressing 09/15/2018 1527 by Lawanda Cousins, RN Outcome: Progressing Goal: Will not experience complications related to urinary retention 09/15/2018 1537 by Lawanda Cousins, RN Outcome: Progressing 09/15/2018 1527 by Lawanda Cousins, RN Outcome: Progressing   Problem: Pain Managment: Goal: General experience of comfort will improve 09/15/2018 1537 by Lawanda Cousins, RN Outcome: Progressing 09/15/2018 1527 by Lawanda Cousins, RN Outcome: Progressing   Problem: Safety: Goal: Ability to remain free from injury will improve 09/15/2018 1537 by Lawanda Cousins, RN Outcome: Progressing 09/15/2018 1527 by Lawanda Cousins, RN Outcome: Progressing   Problem: Skin Integrity: Goal: Risk for impaired skin integrity will decrease 09/15/2018 1537 by Lawanda Cousins, RN Outcome: Progressing 09/15/2018 1527 by Lawanda Cousins, RN Outcome: Progressing
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
## 2018-09-15 NOTE — Anesthesia Postprocedure Evaluation (Signed)
Anesthesia Post Note  Patient: Thomas Anthony  Procedure(s) Performed: OPEN REDUCTION INTERNAL FIXATION (ORIF) PROXIMAL HUMERUS FRACTURE (Right Shoulder)     Patient location during evaluation: PACU Anesthesia Type: Regional and General Level of consciousness: awake and alert Pain management: pain level controlled Vital Signs Assessment: post-procedure vital signs reviewed and stable Respiratory status: spontaneous breathing, nonlabored ventilation, respiratory function stable and patient connected to nasal cannula oxygen Cardiovascular status: blood pressure returned to baseline and stable Postop Assessment: no apparent nausea or vomiting Anesthetic complications: no    Last Vitals:  Vitals:   09/15/18 1315 09/15/18 1330  BP: 134/76 (!) 120/54  Pulse: 67 67  Resp: 18 14  Temp:    SpO2: 100% 100%    Last Pain:  Vitals:   09/15/18 1315  TempSrc:   PainSc: 6                  Brianni Manthe

## 2018-09-15 NOTE — Anesthesia Procedure Notes (Signed)
Procedure Name: Intubation Date/Time: 09/15/2018 8:17 AM Performed by: Neldon Newport, CRNA Pre-anesthesia Checklist: Timeout performed, Patient being monitored, Suction available, Emergency Drugs available and Patient identified Patient Re-evaluated:Patient Re-evaluated prior to induction Oxygen Delivery Method: Circle system utilized Preoxygenation: Pre-oxygenation with 100% oxygen Induction Type: IV induction Ventilation: Mask ventilation without difficulty Laryngoscope Size: Mac and 4 Grade View: Grade I Tube type: Oral Tube size: 7.5 mm Number of attempts: 1 Placement Confirmation: breath sounds checked- equal and bilateral,  positive ETCO2 and ETT inserted through vocal cords under direct vision Secured at: 24 cm Tube secured with: Tape Dental Injury: Teeth and Oropharynx as per pre-operative assessment
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
## 2018-09-15 NOTE — Anesthesia Preprocedure Evaluation (Addendum)
Anesthesia Evaluation  Patient identified by MRN, date of birth, ID band Patient awake    Reviewed: Allergy & Precautions, NPO status , Patient's Chart, lab work & pertinent test results  Airway Mallampati: II  TM Distance: >3 FB Neck ROM: Full    Dental  (+) Dental Advisory Given, Teeth Intact   Pulmonary neg pulmonary ROS,    Pulmonary exam normal breath sounds clear to auscultation       Cardiovascular negative cardio ROS Normal cardiovascular exam Rhythm:Regular Rate:Normal     Neuro/Psych negative neurological ROS  negative psych ROS   GI/Hepatic negative GI ROS, Neg liver ROS,   Endo/Other  negative endocrine ROS  Renal/GU negative Renal ROS     Musculoskeletal negative musculoskeletal ROS (+)   Abdominal   Peds  Hematology negative hematology ROS (+)   Anesthesia Other Findings   Reproductive/Obstetrics                            Anesthesia Physical Anesthesia Plan  ASA: I  Anesthesia Plan: General   Post-op Pain Management: GA combined w/ Regional for post-op pain   Induction: Intravenous  PONV Risk Score and Plan: 3 and Ondansetron, Dexamethasone, Midazolam and Treatment may vary due to age or medical condition  Airway Management Planned: Oral ETT  Additional Equipment: None  Intra-op Plan:   Post-operative Plan: Extubation in OR  Informed Consent: I have reviewed the patients History and Physical, chart, labs and discussed the procedure including the risks, benefits and alternatives for the proposed anesthesia with the patient or authorized representative who has indicated his/her understanding and acceptance.     Dental advisory given and Dental Advisory Given  Plan Discussed with: CRNA  Anesthesia Plan Comments:        Anesthesia Quick Evaluation

## 2018-09-15 NOTE — Progress Notes (Signed)
OT Cancellation Note  Patient Details Name: Thomas Anthony MRN: [id number] DOB: [birth year]   Cancelled Treatment:    Reason Eval/Treat Not Completed: Patient at procedure or test/ unavailable. Pt in OR this morning will await clearance post-op. Golden Circle, OTR/L Acute Rehab Services Pager [phone] Office [phone]     Almon Register 09/15/2018, 7:41 AM

## 2018-09-15 NOTE — Progress Notes (Signed)
PT Cancellation Note  Patient Details Name: Thomas Anthony MRN: [id number] DOB: Nov 17, 2000   Cancelled Treatment:    Reason Eval/Treat Not Completed: Patient not medically ready(pt scheduled for OR this morning will await clearance post-op)   [name] [name] 09/15/2018, 6:53 AM  [place] Pager: [phone] Office: [phone]

## 2018-09-16 ENCOUNTER — Encounter (HOSPITAL_COMMUNITY): Payer: Self-pay | Admitting: Orthopedic Surgery

## 2018-09-16 DIAGNOSIS — S42201B Unspecified fracture of upper end of right humerus, initial encounter for open fracture: Secondary | ICD-10-CM

## 2018-09-16 HISTORY — DX: Unspecified fracture of upper end of right humerus, initial encounter for open fracture: S42.201B

## 2018-09-16 LAB — CBC
HEMATOCRIT: 26.4 % — AB (ref 39.0–52.0)
Hemoglobin: 8.6 g/dL — ABNORMAL LOW (ref 13.0–17.0)
MCH: 23.3 pg — ABNORMAL LOW (ref 26.0–34.0)
MCHC: 32.6 g/dL (ref 30.0–36.0)
MCV: 71.5 fL — AB (ref 80.0–100.0)
Platelets: 202 10*3/uL (ref 150–400)
RBC: 3.69 MIL/uL — ABNORMAL LOW (ref 4.22–5.81)
RDW: 14.1 % (ref 11.5–15.5)
WBC: 9 10*3/uL (ref 4.0–10.5)
nRBC: 0 % (ref 0.0–0.2)

## 2018-09-16 MED ORDER — POLYETHYLENE GLYCOL 3350 17 G PO PACK
17.0000 g | PACK | Freq: Every day | ORAL | Status: DC
  Administered 2018-09-16 – 2018-09-17 (×2): 17 g via ORAL
  Filled 2018-09-16 (×2): qty 1
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
## 2018-09-16 NOTE — Progress Notes (Signed)
PT Cancellation/Discharge Note  Patient Details Name: [name] MRN: [id number] DOB: 2001-05-10   Cancelled Treatment:    Reason Eval/Treat Not Completed: PT screened, no needs identified, will sign off.  OT screened for PT needs. No needs identified at this time.  PT to sign off.  See OT evaluation for details.   Thanks,  Barbarann Ehlers. Annalisa Colonna, PT, DPT  Acute Rehabilitation [phone] pager [phone] office     Wells Guiles B Eisen Robenson 09/16/2018, 11:51 AM
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
## 2018-09-17 ENCOUNTER — Encounter (HOSPITAL_COMMUNITY): Payer: Self-pay | Admitting: Emergency Medicine

## 2018-09-17 LAB — CBC
HEMATOCRIT: 24.2 % — AB (ref 39.0–52.0)
Hemoglobin: 7.9 g/dL — ABNORMAL LOW (ref 13.0–17.0)
MCH: 23.7 pg — ABNORMAL LOW (ref 26.0–34.0)
MCHC: 32.6 g/dL (ref 30.0–36.0)
MCV: 72.7 fL — ABNORMAL LOW (ref 80.0–100.0)
Platelets: 191 10*3/uL (ref 150–400)
RBC: 3.33 MIL/uL — ABNORMAL LOW (ref 4.22–5.81)
RDW: 14.4 % (ref 11.5–15.5)
WBC: 7 10*3/uL (ref 4.0–10.5)
nRBC: 0 % (ref 0.0–0.2)

## 2018-09-17 MED ORDER — DOCUSATE SODIUM 100 MG PO CAPS: 100.0000 mg | ORAL_CAPSULE | Freq: Two times a day (BID) | ORAL | 0 refills | Status: DC

## 2018-09-17 MED ORDER — METHOCARBAMOL 750 MG PO TABS: 750.0000 mg | ORAL_TABLET | Freq: Three times a day (TID) | ORAL | 1 refills | Status: DC

## 2018-09-17 MED ORDER — POLYETHYLENE GLYCOL 3350 17 G PO PACK: 17.0000 g | PACK | Freq: Every day | ORAL | 0 refills | Status: DC

## 2018-09-17 MED ORDER — OXYCODONE HCL 10 MG PO TABS: 10.0000 mg | ORAL_TABLET | Freq: Four times a day (QID) | ORAL | 0 refills | Status: DC | PRN

## 2018-09-17 MED ORDER — GABAPENTIN 600 MG PO TABS: 300.0000 mg | ORAL_TABLET | Freq: Two times a day (BID) | ORAL | 0 refills | Status: DC

## 2018-09-17 MED FILL — METHOCARBAMOL 750 MG TABS: 750 | 15 days supply | Qty: 45 | Fill #0 | Status: TO

## 2018-09-17 MED FILL — GABAPENTIN 300 MG CAPSULE: 300 | 30 days supply | Qty: 60 | Fill #0

## 2018-09-17 MED FILL — oxyCODONE HCL 10 MG TABS: 10 | 7 days supply | Qty: 30 | Fill #0
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
## 2018-09-17 NOTE — Progress Notes (Signed)
Pt with d/c orders. IVs removed. Discharge paperwork, including wound care to R shoulder, reviewed with pt and all questions answered. Pt escorted to car via wheelchair with all belongings. Pharmacy met RN and pt at entrance door to deliver home medications.  

## 2018-11-30 ENCOUNTER — Encounter: Payer: Self-pay | Admitting: Plastic Surgery

## 2018-11-30 ENCOUNTER — Other Ambulatory Visit: Payer: Self-pay

## 2018-11-30 ENCOUNTER — Ambulatory Visit (INDEPENDENT_AMBULATORY_CARE_PROVIDER_SITE_OTHER): Payer: Self-pay | Admitting: Plastic Surgery

## 2018-11-30 VITALS — BP 117/69 | HR 64 | Temp 97.6°F | Ht 70.0 in | Wt 163.2 lb

## 2018-11-30 DIAGNOSIS — N62 Hypertrophy of breast: Secondary | ICD-10-CM
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
## 2019-01-12 ENCOUNTER — Ambulatory Visit: Payer: Medicaid Other | Admitting: Plastic Surgery

## 2019-01-19 ENCOUNTER — Encounter: Payer: Self-pay | Admitting: Surgical

## 2019-01-19 ENCOUNTER — Other Ambulatory Visit: Payer: Self-pay

## 2019-01-19 ENCOUNTER — Ambulatory Visit (INDEPENDENT_AMBULATORY_CARE_PROVIDER_SITE_OTHER): Payer: Medicaid Other | Admitting: Surgical

## 2019-01-19 VITALS — BP 112/77 | HR 63 | Temp 97.1°F | Wt 159.6 lb

## 2019-01-19 DIAGNOSIS — N62 Hypertrophy of breast: Secondary | ICD-10-CM

## 2019-01-19 MED ORDER — CEPHALEXIN 500 MG PO CAPS: 500.0000 mg | ORAL_CAPSULE | Freq: Four times a day (QID) | ORAL | 0 refills | Status: DC

## 2019-01-19 MED ORDER — ONDANSETRON HCL 4 MG PO TABS: 4.0000 mg | ORAL_TABLET | Freq: Three times a day (TID) | ORAL | 0 refills | Status: DC | PRN

## 2019-01-19 MED ORDER — HYDROCODONE-ACETAMINOPHEN 5-325 MG PO TABS: 1.0000 | ORAL_TABLET | Freq: Three times a day (TID) | ORAL | 0 refills | Status: DC | PRN
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
## 2019-01-22 ENCOUNTER — Other Ambulatory Visit: Payer: Self-pay

## 2019-01-22 ENCOUNTER — Ambulatory Visit (HOSPITAL_COMMUNITY)
Admission: EM | Admit: 2019-01-22 | Discharge: 2019-01-22 | Disposition: A | Discharge status: Home / Self Care | Payer: Medicaid Other | Attending: Family Medicine | Admitting: Family Medicine

## 2019-01-22 ENCOUNTER — Encounter (HOSPITAL_COMMUNITY): Payer: Self-pay | Admitting: Emergency Medicine

## 2019-01-22 DIAGNOSIS — R21 Rash and other nonspecific skin eruption: Secondary | ICD-10-CM | POA: Diagnosis not present

## 2019-01-22 MED ORDER — CETIRIZINE HCL 10 MG PO TABS: 10.0000 mg | ORAL_TABLET | Freq: Every day | ORAL | 0 refills | Status: DC

## 2019-01-22 MED ORDER — PERMETHRIN 5 % EX CREA: TOPICAL_CREAM | CUTANEOUS | 1 refills | Status: DC

## 2019-01-22 NOTE — Discharge Instructions (Signed)
Treating you for scabies Apply the cream head to toe at bedtime and leave on for 8 hours.  Wash upon waking up in the morning. May repeat in 1 week if needed Try taking some Zyrtec daily in case this is some sort of allergy related rash. Follow up as needed for continued or worsening symptoms

## 2019-01-22 NOTE — ED Triage Notes (Signed)
Pt presents to [facility] for rash x 2 days, states he started a new soap around that time.
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
## 2019-01-22 NOTE — ED Notes (Signed)
Patient able to ambulate independently  

## 2019-01-27 ENCOUNTER — Encounter (HOSPITAL_BASED_OUTPATIENT_CLINIC_OR_DEPARTMENT_OTHER): Payer: Self-pay | Admitting: *Deleted

## 2019-01-27 ENCOUNTER — Other Ambulatory Visit: Payer: Self-pay

## 2019-02-01 ENCOUNTER — Other Ambulatory Visit (HOSPITAL_COMMUNITY)
Admission: RE | Admit: 2019-02-01 | Discharge: 2019-02-01 | Disposition: A | Discharge status: Home / Self Care | Payer: Medicaid Other | Source: Ambulatory Visit | Attending: Plastic Surgery | Admitting: Plastic Surgery

## 2019-02-01 DIAGNOSIS — Z20828 Contact with and (suspected) exposure to other viral communicable diseases: Secondary | ICD-10-CM | POA: Insufficient documentation

## 2019-02-01 DIAGNOSIS — Z01812 Encounter for preprocedural laboratory examination: Secondary | ICD-10-CM | POA: Diagnosis not present

## 2019-02-01 LAB — SARS CORONAVIRUS 2 (TAT 6-24 HRS): SARS Coronavirus 2: NEGATIVE

## 2019-02-04 ENCOUNTER — Ambulatory Visit (HOSPITAL_BASED_OUTPATIENT_CLINIC_OR_DEPARTMENT_OTHER): Payer: Medicaid Other | Admitting: Anesthesiology

## 2019-02-04 ENCOUNTER — Ambulatory Visit (HOSPITAL_BASED_OUTPATIENT_CLINIC_OR_DEPARTMENT_OTHER)
Admission: RE | Admit: 2019-02-04 | Discharge: 2019-02-04 | Disposition: A | Discharge status: Home / Self Care | Payer: Medicaid Other | Attending: Plastic Surgery | Admitting: Plastic Surgery

## 2019-02-04 ENCOUNTER — Other Ambulatory Visit: Payer: Self-pay

## 2019-02-04 ENCOUNTER — Encounter (HOSPITAL_BASED_OUTPATIENT_CLINIC_OR_DEPARTMENT_OTHER)
Admission: RE | Disposition: A | Discharge status: Home / Self Care | Payer: Self-pay | Source: Home / Self Care | Attending: Plastic Surgery

## 2019-02-04 ENCOUNTER — Encounter (HOSPITAL_BASED_OUTPATIENT_CLINIC_OR_DEPARTMENT_OTHER): Payer: Self-pay | Admitting: Anesthesiology

## 2019-02-04 DIAGNOSIS — F17290 Nicotine dependence, other tobacco product, uncomplicated: Secondary | ICD-10-CM | POA: Insufficient documentation

## 2019-02-04 DIAGNOSIS — N62 Hypertrophy of breast: Secondary | ICD-10-CM | POA: Insufficient documentation

## 2019-02-04 HISTORY — PX: GYNECOMASTIA MASTECTOMY: SHX5265

## 2019-02-04 HISTORY — PX: LIPOSUCTION: SHX10

## 2019-02-04 SURGERY — MASTECTOMY, FOR GYNECOMASTIA
Anesthesia: General | Site: Chest | Laterality: Bilateral

## 2019-02-04 MED ORDER — FENTANYL CITRATE (PF) 100 MCG/2ML IJ SOLN: 25.0000 ug | INTRAMUSCULAR | Status: DC | PRN

## 2019-02-04 MED ORDER — CHLORHEXIDINE GLUCONATE CLOTH 2 % EX PADS: 6.0000 | MEDICATED_PAD | Freq: Once | CUTANEOUS | Status: DC

## 2019-02-04 MED ORDER — CEFAZOLIN SODIUM-DEXTROSE 2-4 GM/100ML-% IV SOLN
INTRAVENOUS | Status: AC
  Filled 2019-02-04: qty 100

## 2019-02-04 MED ORDER — SUFENTANIL CITRATE 50 MCG/ML IV SOLN
INTRAVENOUS | Status: DC | PRN
  Administered 2019-02-04 (×2): 5 ug via INTRAVENOUS

## 2019-02-04 MED ORDER — OXYCODONE HCL 5 MG PO TABS: 5.0000 mg | ORAL_TABLET | ORAL | Status: DC | PRN

## 2019-02-04 MED ORDER — EPINEPHRINE PF 1 MG/ML IJ SOLN
INTRAMUSCULAR | Status: AC
  Filled 2019-02-04: qty 1

## 2019-02-04 MED ORDER — DEXAMETHASONE SODIUM PHOSPHATE 4 MG/ML IJ SOLN
INTRAMUSCULAR | Status: DC | PRN
  Administered 2019-02-04: 10 mg via INTRAVENOUS

## 2019-02-04 MED ORDER — LACTATED RINGERS IV SOLN
INTRAVENOUS | Status: DC
  Administered 2019-02-04 (×2): via INTRAVENOUS

## 2019-02-04 MED ORDER — FENTANYL CITRATE (PF) 100 MCG/2ML IJ SOLN: 50.0000 ug | INTRAMUSCULAR | Status: DC | PRN

## 2019-02-04 MED ORDER — OXYCODONE HCL 5 MG/5ML PO SOLN: 5.0000 mg | Freq: Once | ORAL | Status: DC | PRN

## 2019-02-04 MED ORDER — CEFAZOLIN SODIUM-DEXTROSE 2-4 GM/100ML-% IV SOLN
2.0000 g | INTRAVENOUS | Status: AC
  Administered 2019-02-04: 2 g via INTRAVENOUS

## 2019-02-04 MED ORDER — ACETAMINOPHEN 325 MG PO TABS: 650.0000 mg | ORAL_TABLET | ORAL | Status: DC | PRN

## 2019-02-04 MED ORDER — LIDOCAINE HCL 1 % IJ SOLN
INTRAVENOUS | Status: DC | PRN
  Administered 2019-02-04: 40 mL

## 2019-02-04 MED ORDER — ACETAMINOPHEN 160 MG/5ML PO SOLN: 325.0000 mg | ORAL | Status: DC | PRN

## 2019-02-04 MED ORDER — SUCCINYLCHOLINE CHLORIDE 20 MG/ML IJ SOLN
INTRAMUSCULAR | Status: DC | PRN
  Administered 2019-02-04: 200 mg via INTRAVENOUS

## 2019-02-04 MED ORDER — SODIUM CHLORIDE 0.9% FLUSH: 3.0000 mL | Freq: Two times a day (BID) | INTRAVENOUS | Status: DC

## 2019-02-04 MED ORDER — SODIUM CHLORIDE 0.9 % IV SOLN: 250.0000 mL | INTRAVENOUS | Status: DC | PRN

## 2019-02-04 MED ORDER — MIDAZOLAM HCL 2 MG/2ML IJ SOLN
INTRAMUSCULAR | Status: AC
  Filled 2019-02-04: qty 2

## 2019-02-04 MED ORDER — LIDOCAINE HCL (PF) 1 % IJ SOLN
INTRAMUSCULAR | Status: AC
  Filled 2019-02-04: qty 60

## 2019-02-04 MED ORDER — SCOPOLAMINE 1 MG/3DAYS TD PT72: 1.0000 | MEDICATED_PATCH | Freq: Once | TRANSDERMAL | Status: DC

## 2019-02-04 MED ORDER — BUPIVACAINE-EPINEPHRINE 0.25% -1:200000 IJ SOLN
INTRAMUSCULAR | Status: DC | PRN
  Administered 2019-02-04: 16 mL

## 2019-02-04 MED ORDER — SUFENTANIL CITRATE 50 MCG/ML IV SOLN
INTRAVENOUS | Status: AC
  Filled 2019-02-04: qty 1

## 2019-02-04 MED ORDER — SODIUM CHLORIDE 0.9% FLUSH: 3.0000 mL | INTRAVENOUS | Status: DC | PRN

## 2019-02-04 MED ORDER — MIDAZOLAM HCL 2 MG/2ML IJ SOLN
1.0000 mg | INTRAMUSCULAR | Status: DC | PRN
  Administered 2019-02-04: 2 mg via INTRAVENOUS

## 2019-02-04 MED ORDER — PROPOFOL 10 MG/ML IV BOLUS
INTRAVENOUS | Status: DC | PRN
  Administered 2019-02-04: 200 mg via INTRAVENOUS

## 2019-02-04 MED ORDER — ACETAMINOPHEN 650 MG RE SUPP: 650.0000 mg | RECTAL | Status: DC | PRN

## 2019-02-04 MED ORDER — OXYCODONE HCL 5 MG PO TABS: 5.0000 mg | ORAL_TABLET | Freq: Once | ORAL | Status: DC | PRN

## 2019-02-04 MED ORDER — LIDOCAINE HCL (CARDIAC) PF 100 MG/5ML IV SOSY
PREFILLED_SYRINGE | INTRAVENOUS | Status: DC | PRN
  Administered 2019-02-04: 100 mg via INTRAVENOUS

## 2019-02-04 MED ORDER — ACETAMINOPHEN 325 MG PO TABS: 325.0000 mg | ORAL_TABLET | ORAL | Status: DC | PRN

## 2019-02-04 MED ORDER — ONDANSETRON HCL 4 MG/2ML IJ SOLN: 4.0000 mg | Freq: Once | INTRAMUSCULAR | Status: DC | PRN

## 2019-02-04 MED ORDER — MEPERIDINE HCL 25 MG/ML IJ SOLN: 6.2500 mg | INTRAMUSCULAR | Status: DC | PRN

## 2019-02-04 MED ORDER — ROCURONIUM BROMIDE 100 MG/10ML IV SOLN
INTRAVENOUS | Status: DC | PRN
  Administered 2019-02-04: 20 mg via INTRAVENOUS

## 2019-02-04 MED ORDER — SUGAMMADEX SODIUM 200 MG/2ML IV SOLN
INTRAVENOUS | Status: DC | PRN
  Administered 2019-02-04: 200 mg via INTRAVENOUS

## 2019-02-04 SURGICAL SUPPLY — 56 items
BAG DECANTER FOR FLEXI CONT (MISCELLANEOUS) IMPLANT
BENZOIN TINCTURE PRP APPL 2/3 (GAUZE/BANDAGES/DRESSINGS) IMPLANT
BINDER ABDOMINAL 10 UNV 27-48 (MISCELLANEOUS) ×3 IMPLANT
BLADE SURG 15 STRL LF DISP TIS (BLADE) ×2 IMPLANT
BLADE SURG 15 STRL SS (BLADE) ×4
BNDG ELASTIC 6X5.8 VLCR STR LF (GAUZE/BANDAGES/DRESSINGS) IMPLANT
CANISTER SUCT 1200ML W/VALVE (MISCELLANEOUS) ×3 IMPLANT
CLOSURE WOUND 1/2 X4 (GAUZE/BANDAGES/DRESSINGS)
COVER BACK TABLE REUSABLE LG (DRAPES) ×3 IMPLANT
COVER MAYO STAND REUSABLE (DRAPES) ×3 IMPLANT
COVER WAND RF STERILE (DRAPES) IMPLANT
DECANTER SPIKE VIAL GLASS SM (MISCELLANEOUS) IMPLANT
DERMABOND ADVANCED (GAUZE/BANDAGES/DRESSINGS) ×2
DERMABOND ADVANCED .7 DNX12 (GAUZE/BANDAGES/DRESSINGS) ×1 IMPLANT
DRAIN CHANNEL 7F 3/4 FLAT (WOUND CARE) IMPLANT
DRAPE LAPAROSCOPIC ABDOMINAL (DRAPES) ×3 IMPLANT
DRSG PAD ABDOMINAL 8X10 ST (GAUZE/BANDAGES/DRESSINGS) ×6 IMPLANT
ELECT COATED BLADE 2.86 ST (ELECTRODE) ×3 IMPLANT
ELECT REM PT RETURN 9FT ADLT (ELECTROSURGICAL) ×3
ELECTRODE REM PT RTRN 9FT ADLT (ELECTROSURGICAL) ×1 IMPLANT
EVACUATOR SILICONE 100CC (DRAIN) IMPLANT
FILTER 7/8 IN (FILTER) IMPLANT
GAUZE SPONGE 4X4 12PLY STRL (GAUZE/BANDAGES/DRESSINGS) ×6 IMPLANT
GLOVE BIO SURGEON STRL SZ 6.5 (GLOVE) ×4 IMPLANT
GLOVE BIO SURGEON STRL SZ7 (GLOVE) ×3 IMPLANT
GLOVE BIO SURGEONS STRL SZ 6.5 (GLOVE) ×2
GLOVE BIOGEL PI IND STRL 6.5 (GLOVE) ×1 IMPLANT
GLOVE BIOGEL PI INDICATOR 6.5 (GLOVE) ×2
GLOVE SURG SS PI 6.5 STRL IVOR (GLOVE) ×3 IMPLANT
GOWN STRL REUS W/ TWL LRG LVL3 (GOWN DISPOSABLE) ×3 IMPLANT
GOWN STRL REUS W/TWL LRG LVL3 (GOWN DISPOSABLE) ×6
IV LACTATED RINGERS 1000ML (IV SOLUTION) ×3 IMPLANT
LINER CANISTER 1000CC FLEX (MISCELLANEOUS) ×3 IMPLANT
NDL SAFETY ECLIPSE 18X1.5 (NEEDLE) ×1 IMPLANT
NEEDLE HYPO 18GX1.5 SHARP (NEEDLE) ×2
NEEDLE PRECISIONGLIDE 27X1.5 (NEEDLE) ×3 IMPLANT
NEEDLE SPNL 18GX3.5 QUINCKE PK (NEEDLE) IMPLANT
NS IRRIG 1000ML POUR BTL (IV SOLUTION) ×3 IMPLANT
PACK BASIN DAY SURGERY FS (CUSTOM PROCEDURE TRAY) ×3 IMPLANT
PENCIL BUTTON HOLSTER BLD 10FT (ELECTRODE) ×3 IMPLANT
PIN SAFETY STERILE (MISCELLANEOUS) IMPLANT
SPECIMEN JAR MEDIUM (MISCELLANEOUS) IMPLANT
SPONGE LAP 18X18 RF (DISPOSABLE) ×3 IMPLANT
STRIP CLOSURE SKIN 1/2X4 (GAUZE/BANDAGES/DRESSINGS) IMPLANT
STRIP SUTURE WOUND CLOSURE 1/2 (SUTURE) IMPLANT
SUT MNCRL AB 4-0 PS2 18 (SUTURE) ×3 IMPLANT
SUT MON AB 5-0 PS2 18 (SUTURE) ×6 IMPLANT
SYR 20ML LL LF (SYRINGE) IMPLANT
SYR 50ML LL SCALE MARK (SYRINGE) ×3 IMPLANT
SYR CONTROL 10ML LL (SYRINGE) ×3 IMPLANT
TUBE CONNECTING 20'X1/4 (TUBING) ×1
TUBE CONNECTING 20X1/4 (TUBING) ×2 IMPLANT
TUBING SET GRADUATE ASPIR 12FT (MISCELLANEOUS) ×3 IMPLANT
UNDERPAD 30X30 (UNDERPADS AND DIAPERS) ×6 IMPLANT
VAC PENCILS W/TUBING CLEAR (MISCELLANEOUS) IMPLANT
YANKAUER SUCT BULB TIP NO VENT (SUCTIONS) ×3 IMPLANT

## 2019-02-04 NOTE — Anesthesia Preprocedure Evaluation (Addendum)
Anesthesia Evaluation  Patient identified by MRN, date of birth, ID band Patient awake    Reviewed: Allergy & Precautions, H&P , NPO status , Patient's Chart, lab work & pertinent test results  Airway Mallampati: II  TM Distance: >3 FB Neck ROM: Full    Dental  (+) Dental Advisory Given, Teeth Intact   Pulmonary neg pulmonary ROS,    Pulmonary exam normal breath sounds clear to auscultation       Cardiovascular negative cardio ROS Normal cardiovascular exam Rhythm:Regular Rate:Normal     Neuro/Psych negative neurological ROS  negative psych ROS   GI/Hepatic negative GI ROS, Neg liver ROS,   Endo/Other  negative endocrine ROS  Renal/GU negative Renal ROS     Musculoskeletal negative musculoskeletal ROS (+)   Abdominal   Peds  Hematology negative hematology ROS (+)   Anesthesia Other Findings   Reproductive/Obstetrics                             Anesthesia Physical  Anesthesia Plan  ASA: I  Anesthesia Plan: General   Post-op Pain Management:    Induction: Intravenous  PONV Risk Score and Plan: 3 and Ondansetron, Dexamethasone, Midazolam and Treatment may vary due to age or medical condition  Airway Management Planned: Oral ETT  Additional Equipment: None  Intra-op Plan:   Post-operative Plan: Extubation in OR  Informed Consent: I have reviewed the patients History and Physical, chart, labs and discussed the procedure including the risks, benefits and alternatives for the proposed anesthesia with the patient or authorized representative who has indicated his/her understanding and acceptance.     Dental advisory given and Dental Advisory Given  Plan Discussed with: CRNA, Anesthesiologist and Surgeon  Anesthesia Plan Comments: (  )       Anesthesia Quick Evaluation

## 2019-02-04 NOTE — Anesthesia Procedure Notes (Signed)
Procedure Name: Intubation Date/Time: 02/04/2019 12:28 PM Performed by: Willa Frater, CRNA Pre-anesthesia Checklist: Patient identified, Emergency Drugs available, Suction available and Patient being monitored Patient Re-evaluated:Patient Re-evaluated prior to induction Oxygen Delivery Method: Circle system utilized Preoxygenation: Pre-oxygenation with 100% oxygen Induction Type: IV induction Ventilation: Mask ventilation without difficulty Laryngoscope Size: Mac and 3 Grade View: Grade I Tube type: Oral Tube size: 7.0 mm Number of attempts: 1 Airway Equipment and Method: Stylet and Oral airway Placement Confirmation: ETT inserted through vocal cords under direct vision,  positive ETCO2 and breath sounds checked- equal and bilateral Secured at: 23 cm Tube secured with: Tape Dental Injury: Teeth and Oropharynx as per pre-operative assessment

## 2019-02-04 NOTE — Interval H&P Note (Signed)
History and Physical Interval Note:  02/04/2019 11:52 AM  Thomas Anthony  has presented today for surgery, with the diagnosis of gynocomastia.  The various methods of treatment have been discussed with the patient and family. After consideration of risks, benefits and other options for treatment, the patient has consented to  Procedure(s): MASTECTOMY GYNECOMASTIA (Bilateral) as a surgical intervention.  The patient's history has been reviewed, patient examined, no change in status, stable for surgery.  I have reviewed the patient's chart and labs.  Questions were answered to the patient's satisfaction.     Loel Lofty Dillingham

## 2019-02-04 NOTE — Transfer of Care (Signed)
Immediate Anesthesia Transfer of Care Note  Patient: Debra Calabretta  Procedure(s) Performed: EXCISION BILATERAL BREAST TISSUE (Bilateral Chest) LIPOSUCTION (Bilateral Chest)  Patient Location: PACU  Anesthesia Type:General  Level of Consciousness: awake, alert , oriented and drowsy  Airway & Oxygen Therapy: Patient Spontanous Breathing and Patient connected to face mask oxygen  Post-op Assessment: Report given to RN and Post -op Vital signs reviewed and stable  Post vital signs: Reviewed and stable  Last Vitals:  Vitals Value Taken Time  BP 124/59 02/04/19 1339  Temp    Pulse 101 02/04/19 1340  Resp 24 02/04/19 1340  SpO2 100 % 02/04/19 1340  Vitals shown include unvalidated device data.  Last Pain:  Vitals:   02/04/19 1203  TempSrc: Oral  PainSc: 0-No pain         Complications: No apparent anesthesia complications

## 2019-02-04 NOTE — Discharge Instructions (Addendum)
INSTRUCTIONS FOR AFTER SURGERY   You are having surgery.  You will likely have some questions about what to expect following your operation.  The following information will help you and your family understand what to expect when you are discharged from the hospital.  Following these guidelines will help ensure a smooth recovery and reduce risks of complications.   Postoperative instructions include information on: diet, wound care, medications and physical activity.  AFTER SURGERY Expect to go home after the procedure.    DIET This surgery does not require a specific diet.  However, I have to mention that the healthier you eat the better your body can start healing. It is important to increasing your protein intake.  This means limiting the foods with sugar and carbohydrates.  Focus on vegetables and some meat.  If you have any liposuction during your procedure be sure to drink water.  If your urine is bright yellow, then it is concentrated, and you need to drink more water.  As a general rule after surgery, you should have 8 ounces of water every hour while awake.  If you find you are persistently nauseated or unable to take in liquids let us know.  NO TOBACCO USE or EXPOSURE.  This will slow your healing process and increase the risk of a wound.  WOUND CARE If you don't have a drain: You can shower the day after surgery if you don't have a drain.  Use fragrance free soap.  Dial, [place] and [place] are usually mild on the skin.  If you have a drain: Clean with baby wipes until the drain is removed.   If you have steri-strips / tape directly attached to your skin leave them in place. It is OK to get these wet.  No baths, pools or hot tubs for two weeks. We close your incision to leave the smallest and best-looking scar. No ointment or creams on your incisions until given the go ahead.  Especially not Neosporin (Too many skin reactions with this one).  A few weeks after surgery you can use Mederma and  start massaging the scar. We ask you to wear your binder or sports bra for the first 6 weeks around the clock, including while sleeping. This provides added comfort and helps reduce the fluid accumulation at the surgery site.  ACTIVITY No heavy lifting until cleared by the doctor.  It is OK to walk and climb stairs. In fact, moving your legs is very important to decrease your risk of a blood clot.  It will also help keep you from getting deconditioned.  Every 1 to 2 hours get up and walk for 5 minutes. This will help with a quicker recovery back to normal.  Let pain be your guide so you don't do too much.  NO, you cannot do the spring cleaning and don't plan on taking care of anyone else.  This is your time for TLC.  You will be more comfortable if you sleep and rest with your head elevated either with a few pillows under you or in a recliner.  No stomach sleeping for a few months.  WORK Everyone returns to work at different times. As a rough guide, most people take at least 1 - 2 weeks off prior to returning to work. If you need documentation for your job, bring the forms to your postoperative follow up visit.  DRIVING Arrange for someone to bring you home from the hospital.  You may be able to drive a  few days after surgery but not while taking any narcotics or valium.  BOWEL MOVEMENTS Constipation can occur after anesthesia and while taking pain medication.  It is important to stay ahead for your comfort.  We recommend taking Milk of Magnesia (2 tablespoons; twice a day) while taking the pain pills.  SEROMA This is fluid your body tried to put in the surgical site.  This is normal but if it creates tight skinny skin let us know.  It usually decreases in a few weeks.  WHEN TO CALL Call your surgeon's office if any of the following occur:  Fever 101 degrees F or greater  Excessive bleeding or fluid from the incision site.  Pain that increases over time without aid from the  medications  Redness, warmth, or pus draining from incision sites  Persistent nausea or inability to take in liquids Severe misshapen area that underwent the operation.   Post Anesthesia Home Care Instructions  Activity: Get plenty of rest for the remainder of the day. A responsible individual must stay with you for 24 hours following the procedure.  For the next 24 hours, DO NOT: -Drive a car -Paediatric nurse -Drink alcoholic beverages -Take any medication unless instructed by your physician -Make any legal decisions or sign important papers.  Meals: Start with liquid foods such as gelatin or soup. Progress to regular foods as tolerated. Avoid greasy, spicy, heavy foods. If nausea and/or vomiting occur, drink only clear liquids until the nausea and/or vomiting subsides. Call your physician if vomiting continues.  Special Instructions/Symptoms: Your throat may feel dry or sore from the anesthesia or the breathing tube placed in your throat during surgery. If this causes discomfort, gargle with warm salt water. The discomfort should disappear within 24 hours.  If you had a scopolamine patch placed behind your ear for the management of post- operative nausea and/or vomiting:  1. The medication in the patch is effective for 72 hours, after which it should be removed.  Wrap patch in a tissue and discard in the trash. Wash hands thoroughly with soap and water. 2. You may remove the patch earlier than 72 hours if you experience unpleasant side effects which may include dry mouth, dizziness or visual disturbances. 3. Avoid touching the patch. Wash your hands with soap and water after contact with the patch.

## 2019-02-04 NOTE — Anesthesia Postprocedure Evaluation (Signed)
Anesthesia Post Note  Patient: Dracen Reigle  Procedure(s) Performed: EXCISION BILATERAL BREAST TISSUE (Bilateral Chest) LIPOSUCTION (Bilateral Chest)     Patient location during evaluation: PACU Anesthesia Type: General Level of consciousness: awake and alert Pain management: pain level controlled Vital Signs Assessment: post-procedure vital signs reviewed and stable Respiratory status: spontaneous breathing, nonlabored ventilation, respiratory function stable and patient connected to nasal cannula oxygen Cardiovascular status: blood pressure returned to baseline and stable Postop Assessment: no apparent nausea or vomiting Anesthetic complications: no    Last Vitals:  Vitals:   02/04/19 1445 02/04/19 1510  BP: 119/76 134/82  Pulse: 73 66  Resp: 17 16  Temp:  36.6 C  SpO2: 100% 100%    Last Pain:  Vitals:   02/04/19 1510  TempSrc: Oral  PainSc: 0-No pain                 Montez Hageman

## 2019-02-04 NOTE — Op Note (Signed)
DATE OF OPERATION: 02/04/2019  LOCATION: [facility]  PREOPERATIVE DIAGNOSIS: bilateral gynecomastia  POSTOPERATIVE DIAGNOSIS: Same  PROCEDURE: Excision and liposuction of bilateral breasts for treatment of gynecomastia.  SURGEON: Kadan Millstein Sanger Laelle Bridgett, DO  ASSISTANT: Roetta Sessions, PA  EBL: 10 cc  CONDITION: Stable  COMPLICATIONS: None  INDICATION: The patient, Thomas Anthony, is a 18 y.o. male born on 07-12-2000, is here for treatment of gynecomastia.   PROCEDURE DETAILS:  The patient was seen prior to surgery and marked.  The IV antibiotics were given. The patient was taken to the operating room and given a general anesthetic. A standard time out was performed and all information was confirmed by those in the room. SCDs were placed.   The chest was prepped and draped.  Local with epinephrine was injected at the skin of each breast.   Right:  The #15 blade was used to make a 5 mm incision at the lateral breast.  The tumescent / marcaine was infused in the breast fat.  After waiting several minutes for the local to take effect the liposuction machine was introduced.  There was severe fibrous tissue.  The breast had a small amount of fat.  The incision was then made at the nipple areola skin junction.  A 2 mm thickness of skin of the areola was excised.  The excess fat at the depth of the NAC was excised.  Hemostasis was achieved with electrocautery.  There was good contour.  The 4-0 and 3-0 Monocryl was used to close the incision that was from the 3 - 9 o'clock position.   Left:  The #15 blade was used to make a 5 mm incision at the lateral breast.  The tumescent / marcaine was infused in the breast fat.  After waiting several minutes for the local to take effect the liposuction machine was introduced.  There was severe fibrous tissue.  The breast had a small amount of fat.  The incision was then made at the nipple areola skin junction.  A 1 mm thickness of skin of the  areola was excised.  The excess fat at the depth of the NAC was excised.  Hemostasis was achieved with electrocautery.  There was good contour.  The 4-0 and 3-0 Monocryl was used to close the incision that was from the 3 - 9 o'clock position.   Derma bond was applied. The patient was allowed to wake up and taken to recovery room in stable condition at the end of the case. The family was notified at the end of the case.   The advanced practice practitioner (APP) assisted throughout the case.  The APP was essential in retraction and counter traction when needed to make the case progress smoothly.  This retraction and assistance made it possible to see the tissue plans for the procedure.  The assistance was needed for blood control, tissue re-approximation and assisted with closure of the incision site. \

## 2019-02-05 NOTE — Addendum Note (Signed)
Addendum  created 02/05/19 0747 by Keely Drennan, Ernesta Amble, CRNA   Charge Capture section accepted

## 2019-02-08 ENCOUNTER — Telehealth: Payer: Self-pay | Admitting: Surgical

## 2019-02-08 ENCOUNTER — Encounter (HOSPITAL_BASED_OUTPATIENT_CLINIC_OR_DEPARTMENT_OTHER): Payer: Self-pay | Admitting: Plastic Surgery

## 2019-02-08 ENCOUNTER — Telehealth: Payer: Self-pay

## 2019-02-08 NOTE — Telephone Encounter (Signed)
Patient called the office this afternoon reporting that he is having some complications from his surgery.  I returned Mr. Loughmiller call and he reported that "when it gets hot outside, I noticed swelling."  Mr. Brummet further explained that he noticed the size of his breasts did not appear as he expected postoperatively.  It was explained to him that there is a lot of swelling due to the surgery and the final result of his operation will not be known for a few weeks or longer pending healing time.  He further noted that he has only taken 1 dose of his antibiotic after surgery.  He does not have any excessive pain, no fevers, no chills.  He is returning to work today and feels as if he is ready to do so.  I advised him to avoid lifting anything heavier than 15 pounds, pushing or pulling due to the increased risk of seroma formation.  He understood and said he would take it easy at work.  He also stated that if he felt work was going to be too difficult he would ask his boss to leave early.  He does not notice any redness or warmth of his chest bilaterally.  He has not noticed any drainage.  He does not think that he needs to come in to be seen sooner.  He was just unsure of the changes postoperatively.  He does not believe there is any swelling at this time and reports that it only occurs intermittently, most commonly when it is hot out.  He denies any excessive lifting, work.  I thoroughly counseled him on continuing to take his antibiotic.  He understood and says that he would start taking it.  He also understands the importance of eating healthy, drinking plenty of fluids, avoiding heavy lifting, calling us with any further questions or concerns.  He understands to call if he develops any fever, chills, nausea, vomiting.  We have scheduled to see him on Friday in the clinic.  If he needs to be seen sooner we will be happy to see him sooner.

## 2019-02-08 NOTE — Telephone Encounter (Signed)
Received call from patient who stated that he wanted to speak with clinical staff. Patient stated that he feels like he is having some complications, but unable to explain further. Patient informed of post operative appointment scheduled for Friday and asked if he felt like he needed to come in sooner. Patient denied but requested again to speak with clinical staff. Please contact and advise

## 2019-02-09 NOTE — Telephone Encounter (Signed)
error 

## 2019-02-12 ENCOUNTER — Ambulatory Visit (INDEPENDENT_AMBULATORY_CARE_PROVIDER_SITE_OTHER): Payer: Medicaid Other | Admitting: Surgical

## 2019-02-12 ENCOUNTER — Encounter: Payer: Self-pay | Admitting: Surgical

## 2019-02-12 ENCOUNTER — Other Ambulatory Visit: Payer: Self-pay

## 2019-02-12 VITALS — BP 109/64 | HR 63 | Temp 98.2°F | Ht 71.0 in | Wt 165.0 lb

## 2019-02-12 DIAGNOSIS — N62 Hypertrophy of breast: Secondary | ICD-10-CM

## 2019-02-12 NOTE — Progress Notes (Signed)
Subjective:     Patient ID: Thomas Anthony, male    DOB: 2001/01/28, 18 y.o.   MRN: [id number]  Chief Complaint  Patient presents with  . Follow-up    HPI: The patient is a 18 y.o. male here for follow-up after bilateral excision of excess breast tissue on 02/04/19 with Dr. Ulice Bold.  I received a call from Thomas Anthony on 02/08/19 with questions about his surgery. At that time, he stated he noticed some swelling when it got hot out. He also had not been taking his antibiotic.   He reports that he has not yet finished his antibiotic. His pain is been adequately controlled. His incisions are healing well.  C/D/I.  No sign of infection, seroma, hematoma.  He may need a revision surgery on the right due to the size of his nipple areola comparative to the left side  Review of Systems  Constitutional: Negative.   Respiratory: Negative.   Cardiovascular: Negative.   Genitourinary: Negative.   Musculoskeletal: Negative.   Skin: Negative.   Neurological: Negative.      Objective:   Vital Signs BP 109/64 (BP Location: Left Arm, Patient Position: Sitting, Cuff Size: Large)   Pulse 63   Temp 98.2 F (36.8 C) (Temporal)   Ht 5\' 11"  (1.803 m)   Wt 165 lb (74.8 kg)   SpO2 98%   BMI 23.01 kg/m  Vital Signs and Nursing Note Reviewed  Physical Exam  Constitutional: He is oriented to person, place, and time and well-developed, well-nourished, and in no distress. No distress.  Cardiovascular: Normal rate.  Pulmonary/Chest: Effort normal.    Musculoskeletal: Normal range of motion.        General: No edema.  Neurological: He is alert and oriented to person, place, and time. Gait normal.  Skin: Skin is warm. No rash noted. He is not diaphoretic. No erythema. No pallor.  Psychiatric: Mood and affect normal.      Assessment/Plan:     ICD-10-CM   1. Gynecomastia  N62     Patient doing well. May require a revision on the right side due to large areola. Unable to perform this in the  prior surgery due to blood supply concern. Will allow 6 months for re-vascularization and healing and see patient back at that time. ~ February 2021.  Patient to finish antibiotic. He can stop wearing chest binder on Monday.    Kermit Balo Jaylynne Birkhead, PA-C 02/12/2019, 2:43 PM

## 2019-04-25 ENCOUNTER — Other Ambulatory Visit: Payer: Self-pay

## 2019-04-25 ENCOUNTER — Encounter (HOSPITAL_COMMUNITY): Payer: Self-pay | Admitting: Emergency Medicine

## 2019-04-25 ENCOUNTER — Emergency Department (HOSPITAL_COMMUNITY)
Admission: EM | Admit: 2019-04-25 | Discharge: 2019-04-25 | Disposition: A | Discharge status: Home / Self Care | Payer: Medicaid Other | Attending: Emergency Medicine | Admitting: Emergency Medicine

## 2019-04-25 DIAGNOSIS — Y999 Unspecified external cause status: Secondary | ICD-10-CM | POA: Insufficient documentation

## 2019-04-25 DIAGNOSIS — Z79899 Other long term (current) drug therapy: Secondary | ICD-10-CM | POA: Insufficient documentation

## 2019-04-25 DIAGNOSIS — Y9389 Activity, other specified: Secondary | ICD-10-CM | POA: Insufficient documentation

## 2019-04-25 DIAGNOSIS — S39012A Strain of muscle, fascia and tendon of lower back, initial encounter: Secondary | ICD-10-CM | POA: Insufficient documentation

## 2019-04-25 DIAGNOSIS — Y92410 Unspecified street and highway as the place of occurrence of the external cause: Secondary | ICD-10-CM | POA: Insufficient documentation

## 2019-04-25 DIAGNOSIS — S3992XA Unspecified injury of lower back, initial encounter: Secondary | ICD-10-CM | POA: Diagnosis present

## 2019-04-25 MED ORDER — METHOCARBAMOL 500 MG PO TABS: 500.0000 mg | ORAL_TABLET | Freq: Two times a day (BID) | ORAL | 0 refills | Status: DC

## 2019-04-25 MED ORDER — NAPROXEN 500 MG PO TABS: 500.0000 mg | ORAL_TABLET | Freq: Two times a day (BID) | ORAL | 0 refills | Status: DC
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
## 2019-04-25 NOTE — ED Notes (Signed)
Patient verbalizes understanding of discharge instructions. Opportunity for questioning and answers were provided. Armband removed by staff, pt discharged from ED.  

## 2019-05-19 ENCOUNTER — Emergency Department (HOSPITAL_COMMUNITY)
Admission: EM | Admit: 2019-05-19 | Discharge: 2019-05-19 | Disposition: A | Discharge status: Home / Self Care | Payer: Medicaid Other | Attending: Emergency Medicine | Admitting: Emergency Medicine

## 2019-05-19 ENCOUNTER — Encounter (HOSPITAL_COMMUNITY): Payer: Self-pay

## 2019-05-19 ENCOUNTER — Other Ambulatory Visit: Payer: Self-pay

## 2019-05-19 DIAGNOSIS — Z5321 Procedure and treatment not carried out due to patient leaving prior to being seen by health care provider: Secondary | ICD-10-CM | POA: Insufficient documentation

## 2019-05-19 DIAGNOSIS — J029 Acute pharyngitis, unspecified: Secondary | ICD-10-CM | POA: Diagnosis present

## 2019-05-19 MED ORDER — IBUPROFEN 800 MG PO TABS: 800.0000 mg | ORAL_TABLET | Freq: Once | ORAL | Status: DC

## 2019-05-19 NOTE — ED Triage Notes (Signed)
Pt coming from home c/o sore throat, shortness of breath, and fever that started yesterday morning. 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 Pt ambulatory and talking in complete sentences without distress. No N/V/D, no loss of taste or smell, no chest pain. Reports no known sick contacts.

## 2019-05-19 NOTE — ED Notes (Signed)
Pt called 3 times. No answer.

## 2019-06-23 ENCOUNTER — Other Ambulatory Visit: Payer: Self-pay

## 2019-06-23 ENCOUNTER — Encounter (HOSPITAL_COMMUNITY): Payer: Self-pay

## 2019-06-23 ENCOUNTER — Ambulatory Visit (HOSPITAL_COMMUNITY)
Admission: EM | Admit: 2019-06-23 | Discharge: 2019-06-23 | Disposition: A | Discharge status: Home / Self Care | Payer: Medicaid Other | Attending: Urgent Care | Admitting: Urgent Care

## 2019-06-23 DIAGNOSIS — N4889 Other specified disorders of penis: Secondary | ICD-10-CM

## 2019-06-23 DIAGNOSIS — Q386 Other congenital malformations of mouth: Secondary | ICD-10-CM

## 2019-06-23 DIAGNOSIS — Z113 Encounter for screening for infections with a predominantly sexual mode of transmission: Secondary | ICD-10-CM

## 2019-06-23 DIAGNOSIS — Z7251 High risk heterosexual behavior: Secondary | ICD-10-CM | POA: Diagnosis not present

## 2019-06-23 NOTE — Discharge Instructions (Addendum)
The lesions you have on your skin are Fordyce spots and are very common to find. We do not do anything about these lesions because it is fairly normal. If you develop pain, oozing, bleeding, itching of these areas, that's a different story. Come back for a recheck then.
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
## 2019-06-23 NOTE — ED Triage Notes (Signed)
Pt presents for STD testing with no complaints of any symptoms.

## 2019-06-28 LAB — CYTOLOGY, (ORAL, ANAL, URETHRAL) ANCILLARY ONLY
Chlamydia: NEGATIVE
Neisseria Gonorrhea: NEGATIVE
Trichomonas: NEGATIVE

## 2019-08-13 ENCOUNTER — Encounter: Payer: Self-pay | Admitting: Plastic Surgery

## 2019-08-13 ENCOUNTER — Ambulatory Visit: Payer: Medicaid Other | Admitting: Plastic Surgery

## 2019-08-13 ENCOUNTER — Other Ambulatory Visit: Payer: Self-pay

## 2019-08-13 ENCOUNTER — Ambulatory Visit (INDEPENDENT_AMBULATORY_CARE_PROVIDER_SITE_OTHER): Payer: Medicaid Other | Admitting: Surgical

## 2019-08-13 VITALS — BP 132/70 | HR 78 | Temp 97.5°F | Ht 71.0 in | Wt 165.4 lb

## 2019-08-13 DIAGNOSIS — N62 Hypertrophy of breast: Secondary | ICD-10-CM

## 2019-08-13 NOTE — Progress Notes (Signed)
Subjective:     Patient ID: Jenita Seashore, male    DOB: 2000-12-15, 18 y.o.   MRN: [id number]  Chief Complaint  Patient presents with  . Follow-up    6 mos for Gynecomastia     HPI: The patient is a 19 y.o. male here for follow-up after excision of bilateral breast tissue and liposuction of bilateral gynecomastia on 02/04/2019 with Dr. Foster Simpson.  He is here for 86-month follow-up.  He reports that he is happy with his outcome, but has some concern with the excess tissue of his bilateral NAC.  His incisions have healed really nicely, he does have a history of keloid formation and hypertrophic scarring but it seems as if his periareolar incisions have done well.  He is very happy with this.  He has some questions about further surgical management for excision of excess nipple tissue and to reduce the size of his nipples as well as the ptosis of his nipples.   Review of Systems  Constitutional: Negative.   Respiratory: Negative.   Cardiovascular: Negative.      Objective:   Vital Signs BP 132/70 (BP Location: Left Arm, Patient Position: Sitting, Cuff Size: Normal)   Pulse 78   Temp (!) 97.5 F (36.4 C) (Temporal)   Ht 5\' 11"  (1.803 m)   Wt 165 lb 6.4 oz (75 kg)   SpO2 100%   BMI 23.07 kg/m  Vital Signs and Nursing Note Reviewed Physical Exam  Constitutional: He is oriented to person, place, and time and well-developed, well-nourished, and in no distress. No distress.  Cardiovascular: Normal rate.  Pulmonary/Chest: Effort normal.  Incisions well healed, no keloiding or hypertrophy noted.    Musculoskeletal:        General: Normal range of motion.  Neurological: He is alert and oriented to person, place, and time. Gait normal.  Skin: He is not diaphoretic.  Psychiatric: Mood and affect normal.    Assessment/Plan:     ICD-10-CM   1. Gynecomastia  N62    Overall Mr. Dirksen is doing well, his incisions are well-healed.  He is mostly happy with his outcome, but would  like to talk more about potential interventions for excision of excess nipple tissue.  Dr. Ulice Bold and I were present during the visit and we talked thoroughly about options for management of excess nipple tissue. Prior to his initial gynecomastia surgery he was aware that no nipple rearrangement or intervention was planned at that time due to the risk of necrosis with change of vasculature/blood flow after liposuction and excision of glandular tissue.  At this time 2 options were presented to him, which included a free nipple skin graft or excision of excess areolar tissue at the edges and closure.  We talked about the complications of each procedure.  We will plan for a follow-up via telemetry visit in 1 month to talk more about risks associated with each surgery and present any other options.  Patient is in agreement with this plan.    Call with any questions or concerns.   Kermit Balo Jeison Delpilar, PA-C 08/13/2019, 3:05 PM

## 2019-08-13 NOTE — Progress Notes (Signed)
   Subjective:    Patient ID: Thomas Anthony, male    DOB: [birth year], 19 y.o.   MRN: [id number]  HPI    Review of Systems     Objective:   Physical Exam        Assessment & Plan:

## 2019-09-10 ENCOUNTER — Other Ambulatory Visit: Payer: Self-pay

## 2019-09-10 ENCOUNTER — Telehealth: Payer: Medicaid Other | Admitting: Plastic Surgery

## 2019-09-15 ENCOUNTER — Encounter: Payer: Self-pay | Admitting: Surgical

## 2019-09-15 ENCOUNTER — Other Ambulatory Visit: Payer: Self-pay

## 2019-09-15 ENCOUNTER — Telehealth (INDEPENDENT_AMBULATORY_CARE_PROVIDER_SITE_OTHER): Payer: Medicaid Other | Admitting: Surgical

## 2019-09-15 DIAGNOSIS — N62 Hypertrophy of breast: Secondary | ICD-10-CM

## 2019-09-15 NOTE — Progress Notes (Signed)
Patient is a 19 yo male who presented via telephone for a virtual visit to discuss further surgical options for excess NAC tissue after excision of bilateral breast tissue and liposuction on 02/04/2019. I last saw him on 08/13/2019 and we discussed the option of free nipple skin grafting or excision of excess areolar tissue at the edges and closure. He reports he is leaning towards free nipple skin grafting at this time. We briefly discussed the post-operative changes, including loss of NAC sensation, possibility for incomplete take of the graft, the chance that each NAC will heal with different colors pigmented throughout. He reports he is okay with that and the benefit of having the excess skin removed outweighs the loss of sensation, change in color and possibility for wound development.   I informed him I would consult further with Dr. Marla Roe and after speaking with her have our clinical team reach out to him to schedule an in-person consultation/possible pre-op appointment. He is in agreement with this plan and is positive that he would like the free nipple grafting.   I spent > 10 minutes speaking with Thomas Anthony via telephone.

## 2019-09-15 NOTE — Addendum Note (Signed)
Addended byRoetta Sessions on: 09/15/2019 04:23 PM   Modules accepted: Level of Service

## 2019-10-05 ENCOUNTER — Ambulatory Visit: Payer: Medicaid Other | Admitting: Plastic Surgery

## 2019-10-18 ENCOUNTER — Encounter: Payer: Self-pay | Admitting: Plastic Surgery

## 2019-10-18 ENCOUNTER — Ambulatory Visit (INDEPENDENT_AMBULATORY_CARE_PROVIDER_SITE_OTHER): Payer: Medicaid Other | Admitting: Plastic Surgery

## 2019-10-18 ENCOUNTER — Other Ambulatory Visit: Payer: Self-pay

## 2019-10-18 VITALS — BP 106/69 | HR 69 | Temp 97.8°F | Ht 71.0 in | Wt 162.0 lb

## 2019-10-18 DIAGNOSIS — N62 Hypertrophy of breast: Secondary | ICD-10-CM | POA: Diagnosis not present

## 2019-10-19 ENCOUNTER — Encounter: Payer: Self-pay | Admitting: Plastic Surgery

## 2019-10-19 NOTE — Progress Notes (Signed)
   Subjective:    Patient ID: Thomas Anthony, male    DOB: Aug 11, 2000, 19 y.o.   MRN: [id number]  The patient is a 19 years old bm here for further evaluation of his chest.  He underwent surgery for gynecomastia 8/13. He has a great improvement.  He still does not like the loose area of his areola.  He would like further surgery.  There is slight bulging of the lower part of the areola more than the upper part.  There does not appear to be any significant amount of fat under the pud.  Scars are healing very nicely with no sign of keloid formation or hypertrophic scarring.     Review of Systems  Constitutional: Negative.   HENT: Negative.   Eyes: Negative.   Respiratory: Negative.   Cardiovascular: Negative.   Gastrointestinal: Negative.  Negative for abdominal pain.  Endocrine: Negative.   Genitourinary: Negative.   Musculoskeletal: Negative.   Skin: Negative for color change and wound.  Hematological: Negative.        Objective:   Physical Exam Vitals and nursing note reviewed.  Constitutional:      Appearance: Normal appearance.  Cardiovascular:     Rate and Rhythm: Normal rate.     Pulses: Normal pulses.  Pulmonary:     Effort: Pulmonary effort is normal.  Abdominal:     General: Abdomen is flat. There is no distension.  Skin:    General: Skin is warm.  Neurological:     General: No focal deficit present.     Mental Status: He is alert and oriented to person, place, and time.  Psychiatric:        Mood and Affect: Mood normal.        Assessment & Plan:     ICD-10-CM   1. Gynecomastia  N62    I am concerned about doing a circumferential incision of the areola.  1 for scarring into 4 loss of some of the tissue or pigment.  I think that RF microneedling might be a excellent option due to the small amount of tissue that needs to be tightened.  We discussed this and he is open to the possibility.  We will find out more information and visit this again.  I had like to do a  telemetry visit with him in 2 weeks to discuss it further.  Pictures were obtained of the patient and placed in the chart with the patient's or guardian's permission.

## 2019-11-01 ENCOUNTER — Telehealth: Payer: Medicaid Other | Admitting: Plastic Surgery

## 2019-12-03 ENCOUNTER — Ambulatory Visit: Payer: Medicaid Other | Admitting: Plastic Surgery

## 2020-02-29 ENCOUNTER — Emergency Department (HOSPITAL_COMMUNITY)
Admission: EM | Admit: 2020-02-29 | Discharge: 2020-02-29 | Disposition: A | Discharge status: Home / Self Care | Payer: Medicaid Other | Attending: Emergency Medicine | Admitting: Emergency Medicine

## 2020-02-29 ENCOUNTER — Other Ambulatory Visit: Payer: Self-pay

## 2020-02-29 DIAGNOSIS — Z20822 Contact with and (suspected) exposure to covid-19: Secondary | ICD-10-CM | POA: Diagnosis not present

## 2020-02-29 DIAGNOSIS — R509 Fever, unspecified: Secondary | ICD-10-CM | POA: Diagnosis present

## 2020-02-29 LAB — SARS CORONAVIRUS 2 BY RT PCR (HOSPITAL ORDER, PERFORMED IN ~~LOC~~ HOSPITAL LAB): SARS Coronavirus 2: NEGATIVE

## 2020-02-29 NOTE — ED Triage Notes (Signed)
Patient reports to the ER for Covid/Flu like sx. Patient reports body aches and emesis.

## 2020-02-29 NOTE — Discharge Instructions (Signed)
You have a Covid test pending.  This will take several hours to come back.  You can check online or on MyChart app regarding results.  If you are positive, you will need to quarantine for 14 days.  Return emergency department for difficulty breathing, chest pain or any other worsening concerning symptoms.
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
## 2020-03-19 ENCOUNTER — Encounter (HOSPITAL_COMMUNITY): Payer: Self-pay | Admitting: Emergency Medicine

## 2020-03-19 ENCOUNTER — Other Ambulatory Visit: Payer: Self-pay

## 2020-03-19 ENCOUNTER — Emergency Department (HOSPITAL_COMMUNITY)
Admission: EM | Admit: 2020-03-19 | Discharge: 2020-03-19 | Disposition: A | Discharge status: Home / Self Care | Payer: Medicaid Other | Attending: Emergency Medicine | Admitting: Emergency Medicine

## 2020-03-19 DIAGNOSIS — F17200 Nicotine dependence, unspecified, uncomplicated: Secondary | ICD-10-CM | POA: Insufficient documentation

## 2020-03-19 DIAGNOSIS — R509 Fever, unspecified: Secondary | ICD-10-CM | POA: Diagnosis present

## 2020-03-19 DIAGNOSIS — Z20822 Contact with and (suspected) exposure to covid-19: Secondary | ICD-10-CM

## 2020-03-19 DIAGNOSIS — U071 COVID-19: Secondary | ICD-10-CM | POA: Insufficient documentation

## 2020-03-19 LAB — RESPIRATORY PANEL BY RT PCR (FLU A&B, COVID)
Influenza A by PCR: NEGATIVE
Influenza B by PCR: NEGATIVE
SARS Coronavirus 2 by RT PCR: POSITIVE — AB

## 2020-03-19 NOTE — Progress Notes (Signed)
Pt notified of positive covid test result.  Instructed to quarantine and notify immediate contacts.  Notified to return to ED for worsening symptoms and to call for further questions.  Patient voiced understanding.

## 2020-03-19 NOTE — Discharge Instructions (Addendum)
You were tested today for COVID-19.  Call back or check MyChart for test results.  You should quarantine and presume that you have Covid given your symptoms.  If you develop worsening shortness of breath or chest pain, you should be reevaluated.  Obtain a pulse oximeter to make sure that your oxygen levels stay above 90%.

## 2020-03-19 NOTE — ED Triage Notes (Addendum)
Patient states fever with lost of taste and smell at home. Patient took tylenol at home and improved. Denies chest pain and shortness of breath. Patient would like to be tested for COVID. Patient states he is not vaccinated and is not going to get vaccinated at this time.
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
## 2020-03-20 ENCOUNTER — Encounter: Payer: Self-pay | Admitting: Adult Health

## 2020-03-20 ENCOUNTER — Other Ambulatory Visit (HOSPITAL_COMMUNITY): Payer: Self-pay | Admitting: Adult Health

## 2020-03-20 DIAGNOSIS — U071 COVID-19: Secondary | ICD-10-CM

## 2020-03-20 NOTE — Progress Notes (Signed)
I connected by phone with Thomas Anthony on 03/20/2020 at 3:40 PM to discuss the potential use of a new treatment for mild to moderate COVID-19 viral infection in non-hospitalized patients.  This patient is a 19 y.o. male that meets the FDA criteria for Emergency Use Authorization of COVID monoclonal antibody casirivimab/imdevimab or bamlanivimab/eteseviamb.  Has a (+) direct SARS-CoV-2 viral test result  Has mild or moderate COVID-19   Is NOT hospitalized due to COVID-19  Is within 10 days of symptom onset  Has at least one of the high risk factor(s) for progression to severe COVID-19 and/or hospitalization as defined in EUA.  Specific high risk criteria : Other high risk medical condition per CDC:  tobacco use   I have spoken and communicated the following to the patient or parent/caregiver regarding COVID monoclonal antibody treatment:  1. FDA has authorized the emergency use for the treatment of mild to moderate COVID-19 in adults and pediatric patients with positive results of direct SARS-CoV-2 viral testing who are 89 years of age and older weighing at least 40 kg, and who are at high risk for progressing to severe COVID-19 and/or hospitalization.  2. The significant known and potential risks and benefits of COVID monoclonal antibody, and the extent to which such potential risks and benefits are unknown.  3. Information on available alternative treatments and the risks and benefits of those alternatives, including clinical trials.  4. Patients treated with COVID monoclonal antibody should continue to self-isolate and use infection control measures (e.g., wear mask, isolate, social distance, avoid sharing personal items, clean and disinfect "high touch" surfaces, and frequent handwashing) according to CDC guidelines.   5. The patient or parent/caregiver has the option to accept or refuse COVID monoclonal antibody treatment.  After reviewing this information with the patient, the patient  has agreed to receive one of the available covid 19 monoclonal antibodies and will be provided an appropriate fact sheet prior to infusion. Scot Dock, NP 03/20/2020 3:40 PM

## 2020-03-21 ENCOUNTER — Ambulatory Visit: Payer: Self-pay | Admitting: *Deleted

## 2020-03-21 ENCOUNTER — Ambulatory Visit (HOSPITAL_COMMUNITY): Payer: Medicaid Other | Attending: Pulmonary Disease

## 2020-03-21 NOTE — Telephone Encounter (Signed)
COVID-19 positive patient who called [facility] COVID Line due to sweating profusely.  When this RN spoke with patient he had stopped sweating.  This RN reviewed his COVID-19 symptoms and reviewed home care advice with patient.  Patient verbalized understanding.  Patient encouraged to call [facility] for any questions he may have.   Reason for Disposition  [1] COVID-19 diagnosed by doctor (or NP/PA) AND [2] mild symptoms (e.g., cough, fever, others) AND [4] no complications or SOB  Answer Assessment - Initial Assessment Questions 1. COVID-19 DIAGNOSIS: "Who made your Coronavirus (COVID-19) diagnosis?" "Was it confirmed by a positive lab test?" If not diagnosed by a HCP, ask "Are there lots of cases (community spread) where you live?" (See public health department website, if unsure)     Patient called [facility] because he was sweating profusing.  Patient tested positive for COVID on 9/26.  When this RN returned patient's call he was not longer sweating profusely.  Patient does not have a thermometer.   2. COVID-19 EXPOSURE: "Was there any known exposure to COVID before the symptoms began?" CDC Definition of close contact: within 6 feet (2 meters) for a total of 15 minutes or more over a 24-hour period.      Not sure.   3. ONSET: "When did the COVID-19 symptoms start?"       4. WORST SYMPTOM: "What is your worst symptom?" (e.g., cough, fever, shortness of breath, muscle aches)     Sore throat on and off.  Hot and cold on and off.  Cough is intermittent.  Denies SOB.   5. COUGH: "Do you have a cough?" If Yes, ask: "How bad is the cough?"      Intermittent cough.   6. FEVER: "Do you have a fever?" If Yes, ask: "What is your temperature, how was it measured, and when did it start?"     No way to measure temperature.  But I am cold on and off and hot on an doff.   7. RESPIRATORY STATUS: "Describe your breathing?" (e.g., shortness of breath, wheezing, unable to speak)      No difficulty speaking.   Denies SOB.   8. BETTER-SAME-WORSE: "Are you getting better, staying the same or getting worse compared to yesterday?"  If getting worse, ask, "In what way?"     Getting bete.   9. HIGH RISK DISEASE: "Do you have any chronic medical problems?" (e.g., asthma, heart or lung disease, weak immune system, obesity, etc.)    No  10. PREGNANCY: "Is there any chance you are pregnant?" "When was your last menstrual period?"       N/A 11. OTHER SYMPTOMS: "Do you have any other symptoms?"  (e.g., chills, fatigue, headache, loss of smell or taste, muscle pain, sore throat; new loss of smell or taste especially support the diagnosis of COVID-19)       "I am fatigued, just exhausted.  I have been taking Tylenol every six hours for fever and sore throat, drinking power aide and water, and quarantining.  Protocols used: CORONAVIRUS (COVID-19) DIAGNOSED OR SUSPECTED-A-AH

## 2021-07-24 ENCOUNTER — Ambulatory Visit (HOSPITAL_COMMUNITY): Payer: Medicaid Other | Admitting: Licensed Clinical Social Worker

## 2022-01-04 ENCOUNTER — Encounter: Payer: Self-pay | Admitting: Family Medicine

## 2022-01-04 ENCOUNTER — Ambulatory Visit (INDEPENDENT_AMBULATORY_CARE_PROVIDER_SITE_OTHER): Payer: Medicaid Other | Admitting: Family Medicine

## 2022-01-04 VITALS — BP 122/70 | HR 59 | Temp 97.8°F | Ht 70.5 in | Wt 180.0 lb

## 2022-01-04 DIAGNOSIS — R4589 Other symptoms and signs involving emotional state: Secondary | ICD-10-CM | POA: Diagnosis not present

## 2022-01-04 DIAGNOSIS — R41840 Attention and concentration deficit: Secondary | ICD-10-CM | POA: Diagnosis not present

## 2022-01-04 LAB — T4, FREE: Free T4: 0.86 ng/dL (ref 0.60–1.60)

## 2022-01-04 LAB — COMPREHENSIVE METABOLIC PANEL
ALT: 13 U/L (ref 0–53)
AST: 19 U/L (ref 0–37)
Albumin: 4.4 g/dL (ref 3.5–5.2)
Alkaline Phosphatase: 44 U/L (ref 39–117)
BUN: 13 mg/dL (ref 6–23)
CO2: 30 mEq/L (ref 19–32)
Calcium: 9.6 mg/dL (ref 8.4–10.5)
Chloride: 104 mEq/L (ref 96–112)
Creatinine, Ser: 1.15 mg/dL (ref 0.40–1.50)
GFR: 89.99 mL/min (ref >60.00)
Glucose, Bld: 98 mg/dL (ref 70–99)
Potassium: 4.5 mEq/L (ref 3.5–5.1)
Sodium: 139 mEq/L (ref 135–145)
Total Bilirubin: 0.8 mg/dL (ref 0.2–1.2)
Total Protein: 7.1 g/dL (ref 6.0–8.3)

## 2022-01-04 LAB — CBC WITH DIFFERENTIAL/PLATELET
Basophils Absolute: 0 10*3/uL (ref 0.0–0.1)
Basophils Relative: 0.4 % (ref 0.0–3.0)
Eosinophils Absolute: 0.4 10*3/uL (ref 0.0–0.7)
Eosinophils Relative: 6.9 % — ABNORMAL HIGH (ref 0.0–5.0)
HCT: 41.6 % (ref 39.0–52.0)
Hemoglobin: 13.3 g/dL (ref 13.0–17.0)
Lymphocytes Relative: 43.7 % (ref 12.0–46.0)
Lymphs Abs: 2.4 10*3/uL (ref 0.7–4.0)
MCHC: 32 g/dL (ref 30.0–36.0)
MCV: 72 fl — ABNORMAL LOW (ref 78.0–100.0)
Monocytes Absolute: 0.5 10*3/uL (ref 0.1–1.0)
Monocytes Relative: 8.2 % (ref 3.0–12.0)
Neutro Abs: 2.3 10*3/uL (ref 1.4–7.7)
Neutrophils Relative %: 40.8 % — ABNORMAL LOW (ref 43.0–77.0)
Platelets: 231 10*3/uL (ref 150.0–400.0)
RBC: 5.78 Mil/uL (ref 4.22–5.81)
RDW: 14.2 % (ref 11.5–15.5)
WBC: 5.5 10*3/uL (ref 4.0–10.5)

## 2022-01-04 LAB — TSH: TSH: 2.19 u[IU]/mL (ref 0.35–5.50)

## 2022-01-04 NOTE — Assessment & Plan Note (Addendum)
He is new to the practice today.  He is a pleasant 21 year old male who is currently working and going to [employer].  Recalls having issues with concentration and focusing for many years.  He has never been tested for ADHD.  His family members keep telling him he needs to be tested as he is having problems focusing.  Denies substance abuse.  Referral to psychiatry for ADHD testing. Check labs
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
## 2022-01-04 NOTE — Assessment & Plan Note (Signed)
He recalls fidgeting most of his life.  Thinks he may have ADHD.  He is working and going to Armed forces operational officer.  Referral made for ADHD testing.

## 2022-01-04 NOTE — Patient Instructions (Addendum)
Thank you for trusting [place] with your health care.   Please go downstairs to the first level for lab work before you leave today.  I would like for you to have testing for attention deficit disorder.  I will place a referral to have someone contact you about this but you can also check with your insurance and find out who they recommend for ADHD testing.

## 2022-01-05 ENCOUNTER — Encounter: Payer: Self-pay | Admitting: Family Medicine

## 2022-01-09 NOTE — Telephone Encounter (Signed)
Pt states "called the number on the back of my insurance card about the ADHD testing they said they don't have anyone to recommend me to and the referral will be only on your end."  Pt would like orders to be placed for ADHD testing as his insurance doesn't have anyone to recommend him to.

## 2022-10-09 ENCOUNTER — Ambulatory Visit (INDEPENDENT_AMBULATORY_CARE_PROVIDER_SITE_OTHER): Payer: Self-pay | Admitting: Family Medicine

## 2022-10-09 ENCOUNTER — Encounter: Payer: Self-pay | Admitting: Family Medicine

## 2022-10-09 VITALS — BP 120/68 | HR 70 | Temp 97.6°F | Ht 70.5 in | Wt 185.0 lb

## 2022-10-09 DIAGNOSIS — R519 Headache, unspecified: Secondary | ICD-10-CM

## 2022-10-09 DIAGNOSIS — N62 Hypertrophy of breast: Secondary | ICD-10-CM

## 2022-10-09 DIAGNOSIS — N643 Galactorrhea not associated with childbirth: Secondary | ICD-10-CM

## 2022-10-09 DIAGNOSIS — R5383 Other fatigue: Secondary | ICD-10-CM

## 2022-10-09 DIAGNOSIS — N61 Mastitis without abscess: Secondary | ICD-10-CM

## 2022-10-09 LAB — COMPREHENSIVE METABOLIC PANEL
ALT: 14 U/L (ref 0–53)
AST: 19 U/L (ref 0–37)
Albumin: 4.3 g/dL (ref 3.5–5.2)
Alkaline Phosphatase: 46 U/L (ref 39–117)
BUN: 11 mg/dL (ref 6–23)
CO2: 32 mEq/L (ref 19–32)
Calcium: 9.5 mg/dL (ref 8.4–10.5)
Chloride: 103 mEq/L (ref 96–112)
Creatinine, Ser: 1.15 mg/dL (ref 0.40–1.50)
GFR: 89.51 mL/min (ref >60.00)
Glucose, Bld: 78 mg/dL (ref 70–99)
Potassium: 4.6 mEq/L (ref 3.5–5.1)
Sodium: 140 mEq/L (ref 135–145)
Total Bilirubin: 0.6 mg/dL (ref 0.2–1.2)
Total Protein: 7.1 g/dL (ref 6.0–8.3)

## 2022-10-09 LAB — CBC WITH DIFFERENTIAL/PLATELET
Basophils Absolute: 0 10*3/uL (ref 0.0–0.1)
Basophils Relative: 0.3 % (ref 0.0–3.0)
Eosinophils Absolute: 0.4 10*3/uL (ref 0.0–0.7)
Eosinophils Relative: 8.6 % — ABNORMAL HIGH (ref 0.0–5.0)
HCT: 42.7 % (ref 39.0–52.0)
Hemoglobin: 14 g/dL (ref 13.0–17.0)
Lymphocytes Relative: 40 % (ref 12.0–46.0)
Lymphs Abs: 1.9 10*3/uL (ref 0.7–4.0)
MCHC: 32.8 g/dL (ref 30.0–36.0)
MCV: 73 fl — ABNORMAL LOW (ref 78.0–100.0)
Monocytes Absolute: 0.3 10*3/uL (ref 0.1–1.0)
Monocytes Relative: 6.8 % (ref 3.0–12.0)
Neutro Abs: 2.2 10*3/uL (ref 1.4–7.7)
Neutrophils Relative %: 44.3 % (ref 43.0–77.0)
Platelets: 225 10*3/uL (ref 150.0–400.0)
RBC: 5.85 Mil/uL — ABNORMAL HIGH (ref 4.22–5.81)
RDW: 14 % (ref 11.5–15.5)
WBC: 4.9 10*3/uL (ref 4.0–10.5)

## 2022-10-09 LAB — TSH: TSH: 0.93 u[IU]/mL (ref 0.35–5.50)

## 2022-10-09 LAB — T4, FREE: Free T4: 0.78 ng/dL (ref 0.60–1.60)

## 2022-10-09 LAB — VITAMIN D 25 HYDROXY (VIT D DEFICIENCY, FRACTURES): VITD: 13.77 ng/mL — ABNORMAL LOW (ref 30.00–100.00)

## 2022-10-09 LAB — VITAMIN B12: Vitamin B-12: 348 pg/mL (ref 211–911)

## 2022-10-09 MED ORDER — AMOXICILLIN-POT CLAVULANATE 875-125 MG PO TABS: 1.0000 | ORAL_TABLET | Freq: Two times a day (BID) | ORAL | 0 refills | Status: DC
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
## 2022-10-09 NOTE — Patient Instructions (Signed)
Please go downstairs for labs before you leave.  You will hear from [facility] imaging to schedule an MRI of your brain.  Take the antibiotic as prescribed.  We will be in touch with your results and recommendations.

## 2022-10-12 LAB — WOUND CULTURE

## 2022-10-12 LAB — PROLACTIN: Prolactin: 9 ng/mL (ref 2.0–18.0)

## 2022-10-12 LAB — ESTRADIOL: Estradiol: 23 pg/mL (ref <=39)

## 2022-10-13 ENCOUNTER — Other Ambulatory Visit: Payer: Self-pay | Admitting: Family Medicine

## 2022-10-13 DIAGNOSIS — E559 Vitamin D deficiency, unspecified: Secondary | ICD-10-CM

## 2022-10-13 MED ORDER — VITAMIN D (ERGOCALCIFEROL) 1.25 MG (50000 UNIT) PO CAPS: 50000.0000 [IU] | ORAL_CAPSULE | ORAL | 2 refills | Status: AC

## 2022-10-13 NOTE — Progress Notes (Signed)
Please reach out to him. Is he improving on the antibiotic? He has not scheduled his MRI of his brain yet, I am waiting on this information. Also, his vitamin D is low so I will send in a prescription of once weekly vitamin D.

## 2022-10-15 ENCOUNTER — Encounter: Payer: Self-pay | Admitting: Family Medicine

## 2022-10-15 ENCOUNTER — Other Ambulatory Visit: Payer: Self-pay | Admitting: Family Medicine

## 2022-10-15 DIAGNOSIS — N6452 Nipple discharge: Secondary | ICD-10-CM

## 2022-10-15 DIAGNOSIS — N61 Mastitis without abscess: Secondary | ICD-10-CM

## 2022-10-15 MED ORDER — SULFAMETHOXAZOLE-TRIMETHOPRIM 800-160 MG PO TABS: 1.0000 | ORAL_TABLET | Freq: Two times a day (BID) | ORAL | 0 refills | Status: AC

## 2022-10-15 NOTE — Progress Notes (Signed)
I am switching his antibiotic since he is not improving and based on the culture result. Please ask him if he is still having headaches. Please ask him to call [facility] to get his MRI scheduled.

## 2022-10-23 ENCOUNTER — Encounter: Payer: Self-pay | Admitting: Family Medicine

## 2022-10-23 ENCOUNTER — Ambulatory Visit (INDEPENDENT_AMBULATORY_CARE_PROVIDER_SITE_OTHER): Payer: Medicaid Other | Admitting: Family Medicine

## 2022-10-23 VITALS — BP 112/68 | HR 75 | Temp 97.6°F | Ht 70.5 in | Wt 180.0 lb

## 2022-10-23 DIAGNOSIS — N61 Mastitis without abscess: Secondary | ICD-10-CM | POA: Diagnosis not present

## 2022-10-23 DIAGNOSIS — N643 Galactorrhea not associated with childbirth: Secondary | ICD-10-CM | POA: Diagnosis not present

## 2022-10-23 DIAGNOSIS — L2082 Flexural eczema: Secondary | ICD-10-CM

## 2022-10-23 DIAGNOSIS — Z9889 Other specified postprocedural states: Secondary | ICD-10-CM

## 2022-10-23 DIAGNOSIS — N62 Hypertrophy of breast: Secondary | ICD-10-CM | POA: Diagnosis not present

## 2022-10-23 DIAGNOSIS — R519 Headache, unspecified: Secondary | ICD-10-CM

## 2022-10-23 LAB — CBC WITH DIFFERENTIAL/PLATELET
Basophils Absolute: 0 10*3/uL (ref 0.0–0.1)
Basophils Relative: 0.3 % (ref 0.0–3.0)
Eosinophils Absolute: 0.5 10*3/uL (ref 0.0–0.7)
Eosinophils Relative: 10.5 % — ABNORMAL HIGH (ref 0.0–5.0)
HCT: 46.5 % (ref 39.0–52.0)
Hemoglobin: 14.9 g/dL (ref 13.0–17.0)
Lymphocytes Relative: 40 % (ref 12.0–46.0)
Lymphs Abs: 2 10*3/uL (ref 0.7–4.0)
MCHC: 31.9 g/dL (ref 30.0–36.0)
MCV: 73.1 fl — ABNORMAL LOW (ref 78.0–100.0)
Monocytes Absolute: 0.6 10*3/uL (ref 0.1–1.0)
Monocytes Relative: 12 % (ref 3.0–12.0)
Neutro Abs: 1.9 10*3/uL (ref 1.4–7.7)
Neutrophils Relative %: 37.2 % — ABNORMAL LOW (ref 43.0–77.0)
Platelets: 261 10*3/uL (ref 150.0–400.0)
RBC: 6.37 Mil/uL — ABNORMAL HIGH (ref 4.22–5.81)
RDW: 13.9 % (ref 11.5–15.5)
WBC: 5 10*3/uL (ref 4.0–10.5)

## 2022-10-23 MED ORDER — CEPHALEXIN 500 MG PO CAPS
500.0000 mg | ORAL_CAPSULE | Freq: Two times a day (BID) | ORAL | 0 refills | Status: AC
Start: 2022-10-23 — End: ?

## 2022-10-23 NOTE — Patient Instructions (Signed)
Please go downstairs for labs before you leave.  I sent in an antibiotic for you to take twice daily for the next 10 days.  You should receive a call from the breast center to schedule a breast ultrasound.  Get your brain MRI done as scheduled later this month.  We will be in touch once we have all of these results.
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
## 2022-10-27 LAB — TESTOSTERONE, FREE & TOTAL
Free Testosterone: 105.3 pg/mL (ref 35.0–155.0)
Testosterone, Total, LC-MS-MS: 534 ng/dL (ref 250–1100)

## 2022-10-27 LAB — HIV ANTIBODY (ROUTINE TESTING W REFLEX): HIV 1&2 Ab, 4th Generation: NONREACTIVE

## 2022-10-29 ENCOUNTER — Other Ambulatory Visit: Payer: Self-pay | Admitting: Family Medicine

## 2022-10-29 ENCOUNTER — Telehealth: Payer: Self-pay

## 2022-10-29 ENCOUNTER — Other Ambulatory Visit: Payer: Self-pay

## 2022-10-29 DIAGNOSIS — N643 Galactorrhea not associated with childbirth: Secondary | ICD-10-CM

## 2022-10-29 DIAGNOSIS — N6452 Nipple discharge: Secondary | ICD-10-CM

## 2022-10-29 DIAGNOSIS — Z9889 Other specified postprocedural states: Secondary | ICD-10-CM

## 2022-10-29 DIAGNOSIS — N61 Mastitis without abscess: Secondary | ICD-10-CM

## 2022-10-29 NOTE — Addendum Note (Signed)
Addended by: Marinus Maw on: 10/29/2022 10:45 AM   Modules accepted: Orders

## 2022-10-29 NOTE — Telephone Encounter (Signed)
US breast bilateral orders are not releasing for some reason in EPIC to get pt scheduled. Called [facility] and they said I can fax orders over and pt will be scheduled. Faxed orders to them at [phone], confirmation fax received.

## 2022-10-29 NOTE — Addendum Note (Signed)
Addended by: Marinus Maw on: 10/29/2022 10:49 AM   Modules accepted: Orders

## 2022-10-29 NOTE — Progress Notes (Signed)
Please call him and let him know that he does not need to get the brain MRI based on normal hormone levels.  I would like for him to get the breast ultrasound.  Does he have that scheduled?

## 2022-11-06 ENCOUNTER — Other Ambulatory Visit: Payer: Medicaid Other

## 2022-11-08 ENCOUNTER — Encounter: Payer: Self-pay | Admitting: Family Medicine

## 2022-11-08 NOTE — Telephone Encounter (Signed)
Pt finished abx but still not seeing any differences. Still leaking and crusting. Has [place] scheduled for 5/22

## 2022-11-11 ENCOUNTER — Other Ambulatory Visit: Payer: Self-pay | Admitting: Family Medicine

## 2022-11-11 DIAGNOSIS — Z9889 Other specified postprocedural states: Secondary | ICD-10-CM

## 2022-11-11 DIAGNOSIS — N62 Hypertrophy of breast: Secondary | ICD-10-CM

## 2022-11-11 DIAGNOSIS — N643 Galactorrhea not associated with childbirth: Secondary | ICD-10-CM

## 2022-11-13 ENCOUNTER — Ambulatory Visit
Admission: RE | Admit: 2022-11-13 | Discharge: 2022-11-13 | Disposition: A | Discharge status: Home / Self Care | Payer: Medicaid Other | Source: Ambulatory Visit | Attending: Family Medicine | Admitting: Family Medicine

## 2022-11-13 ENCOUNTER — Telehealth: Payer: Self-pay

## 2022-11-13 DIAGNOSIS — N643 Galactorrhea not associated with childbirth: Secondary | ICD-10-CM

## 2022-11-13 NOTE — Progress Notes (Signed)
[place] of breast does not show a cause for his symptoms. I placed a referral to general surgery which is recommended. Please see if he has scheduled with them as of yet.

## 2022-11-13 NOTE — Telephone Encounter (Signed)
Ultrasound has been done and resulted, can we please move forward with general surgery referral? Thanks!

## 2022-11-14 ENCOUNTER — Telehealth: Payer: Self-pay | Admitting: Family Medicine

## 2022-11-14 NOTE — Telephone Encounter (Signed)
Called patient and informed him to call his insurance company to see which gen surgery office is in network, once he does patient will contact the office and we will update the referral

## 2022-11-14 NOTE — Telephone Encounter (Addendum)
Called Nikki at the [facility], She stated patient has an appt with [facility], but they are not in network and patient will be self pay. I reached out to our referral team to see what office the referral that was placed on 5/20 for Gen Surgery. Will contact patient with information on appts.

## 2022-11-14 NOTE — Telephone Encounter (Signed)
Nikki with the breast center [facility] called states patient needs a referral sent for a surgical consult, the one they sent out was out of network for the patient and he needs one in network. A good callback number is [phone].
